# Patient Record
Sex: Female | Born: 1935 | Race: White | Hispanic: Yes | Marital: Single | Smoking: Never smoker
Health system: Southern US, Community
[De-identification: ages and names within clinical notes are randomized; demographics above are authoritative.]

## PROBLEM LIST (undated history)

## (undated) DIAGNOSIS — I10 Essential (primary) hypertension: Secondary | ICD-10-CM

## (undated) DIAGNOSIS — F0390 Unspecified dementia without behavioral disturbance: Secondary | ICD-10-CM

---

## 2020-07-02 ENCOUNTER — Encounter (HOSPITAL_COMMUNITY): Payer: Self-pay | Admitting: Emergency Medicine

## 2020-07-02 ENCOUNTER — Emergency Department (HOSPITAL_COMMUNITY)
Admission: EM | Admit: 2020-07-02 | Discharge: 2020-07-02 | Disposition: A | Discharge status: Home / Self Care | Payer: Medicaid Other | Attending: Emergency Medicine | Admitting: Emergency Medicine

## 2020-07-02 ENCOUNTER — Emergency Department (HOSPITAL_COMMUNITY): Payer: Medicaid Other

## 2020-07-02 DIAGNOSIS — Z20822 Contact with and (suspected) exposure to covid-19: Secondary | ICD-10-CM | POA: Diagnosis not present

## 2020-07-02 DIAGNOSIS — I10 Essential (primary) hypertension: Secondary | ICD-10-CM | POA: Diagnosis not present

## 2020-07-02 DIAGNOSIS — J209 Acute bronchitis, unspecified: Secondary | ICD-10-CM | POA: Diagnosis not present

## 2020-07-02 DIAGNOSIS — R509 Fever, unspecified: Secondary | ICD-10-CM | POA: Diagnosis present

## 2020-07-02 DIAGNOSIS — J208 Acute bronchitis due to other specified organisms: Secondary | ICD-10-CM

## 2020-07-02 LAB — CBC WITH DIFFERENTIAL/PLATELET
Abs Immature Granulocytes: 0.02 10*3/uL (ref 0.00–0.07)
Basophils Absolute: 0 10*3/uL (ref 0.0–0.1)
Basophils Relative: 1 %
Eosinophils Absolute: 0.1 10*3/uL (ref 0.0–0.5)
Eosinophils Relative: 1 %
HCT: 38.2 % (ref 36.0–46.0)
Hemoglobin: 12.5 g/dL (ref 12.0–15.0)
Immature Granulocytes: 0 %
Lymphocytes Relative: 19 %
Lymphs Abs: 1.2 10*3/uL (ref 0.7–4.0)
MCH: 29.9 pg (ref 26.0–34.0)
MCHC: 32.7 g/dL (ref 30.0–36.0)
MCV: 91.4 fL (ref 80.0–100.0)
Monocytes Absolute: 0.8 10*3/uL (ref 0.1–1.0)
Monocytes Relative: 13 %
Neutro Abs: 4.1 10*3/uL (ref 1.7–7.7)
Neutrophils Relative %: 66 %
Platelets: 233 10*3/uL (ref 150–400)
RBC: 4.18 MIL/uL (ref 3.87–5.11)
RDW: 12.8 % (ref 11.5–15.5)
WBC: 6.2 10*3/uL (ref 4.0–10.5)
nRBC: 0 % (ref 0.0–0.2)

## 2020-07-02 LAB — BASIC METABOLIC PANEL
Anion gap: 11 (ref 5–15)
BUN: 14 mg/dL (ref 8–23)
CO2: 24 mmol/L (ref 22–32)
Calcium: 8.9 mg/dL (ref 8.9–10.3)
Chloride: 98 mmol/L (ref 98–111)
Creatinine, Ser: 0.98 mg/dL (ref 0.44–1.00)
GFR, Estimated: 55 mL/min — ABNORMAL LOW (ref >60)
Glucose, Bld: 88 mg/dL (ref 70–99)
Potassium: 5 mmol/L (ref 3.5–5.1)
Sodium: 133 mmol/L — ABNORMAL LOW (ref 135–145)

## 2020-07-02 LAB — RESP PANEL BY RT-PCR (FLU A&B, COVID) ARPGX2
Influenza A by PCR: NEGATIVE
Influenza B by PCR: NEGATIVE
SARS Coronavirus 2 by RT PCR: NEGATIVE

## 2020-07-02 MED ORDER — IPRATROPIUM-ALBUTEROL 0.5-2.5 (3) MG/3ML IN SOLN
3.0000 mL | Freq: Once | RESPIRATORY_TRACT | Status: AC
  Administered 2020-07-02: 3 mL via RESPIRATORY_TRACT
  Filled 2020-07-02: qty 3

## 2020-07-02 MED ORDER — PREDNISONE 20 MG PO TABS
40.0000 mg | ORAL_TABLET | Freq: Once | ORAL | Status: AC
  Administered 2020-07-02: 40 mg via ORAL
  Filled 2020-07-02: qty 2

## 2020-07-02 MED ORDER — BENZONATATE 100 MG PO CAPS
100.0000 mg | ORAL_CAPSULE | Freq: Once | ORAL | Status: AC
  Administered 2020-07-02: 100 mg via ORAL
  Filled 2020-07-02: qty 1

## 2020-07-02 MED ORDER — PREDNISONE 10 MG PO TABS
40.0000 mg | ORAL_TABLET | Freq: Every day | ORAL | 0 refills | Status: DC
Start: 2020-07-02 — End: 2020-10-14

## 2020-07-02 MED ORDER — BENZONATATE 100 MG PO CAPS: 100.0000 mg | ORAL_CAPSULE | Freq: Three times a day (TID) | ORAL | 0 refills | Status: AC | PRN

## 2020-07-02 NOTE — ED Provider Notes (Signed)
[facility] EMERGENCY DEPARTMENT Provider Note   CSN: [id number] Arrival date & time: 07/02/20  1502     History Chief Complaint  Patient presents with  . Fever  . Shortness of Breath    Sharon Reed is a 87 y.o. female.  The history is provided by the patient and a relative. A language interpreter was used.  Fever Shortness of Breath Associated symptoms: fever    Sharon Reed is a 87 y.o. female who presents to the Emergency Department complaining of short of breath. She presents the emergency department accompanied by her daughter for evaluation of shortness of breath, cough that started three days ago. She had a fever to 102 today. She has chest discomfort when she coughs, otherwise no chest pain. No known sick contacts. She had a similar episode about one month ago, which improved after steroids and breathing treatments. She does have breathing treatments available at home that do seem to partially improve her symptoms. She was seen in urgent care yesterday and prescribed Levaquin and tested negative for COVID-19 at that time. She has a history of hypertension, no additional medical problems. No prior history of blood clots.     History reviewed. No pertinent past medical history.  There are no problems to display for this patient.   History reviewed. No pertinent surgical history.   OB History   No obstetric history on file.     No family history on file.  Social History   Tobacco Use  . Smoking status: Never Smoker  . Smokeless tobacco: Never Used  Substance Use Topics  . Alcohol use: Not Currently  . Drug use: Not Currently    Home Medications Prior to Admission medications   Not on File    Allergies    Patient has no allergy information on record.  Review of Systems   Review of Systems  Constitutional: Positive for fever.  Respiratory: Positive for shortness of breath.   All other systems reviewed and are negative.   Physical  Exam Updated Vital Signs BP (!) 160/82   Pulse 72   Temp 99.2 F (37.3 C) (Oral)   Resp 18   Ht 5' (1.524 m)   Wt 46.7 kg   SpO2 97%   BMI 20.12 kg/m   Physical Exam Vitals and nursing note reviewed.  Constitutional:      Appearance: She is well-developed.  HENT:     Head: Normocephalic and atraumatic.  Cardiovascular:     Rate and Rhythm: Normal rate and regular rhythm.     Heart sounds: No murmur heard.   Pulmonary:     Effort: Pulmonary effort is normal. No respiratory distress.     Comments: Decreased air movement bilaterally. Normal work of breathing. Abdominal:     Palpations: Abdomen is soft.     Tenderness: There is no abdominal tenderness. There is no guarding or rebound.  Musculoskeletal:        General: No swelling or tenderness.  Skin:    General: Skin is warm and dry.  Neurological:     Mental Status: She is alert and oriented to person, place, and time.  Psychiatric:        Behavior: Behavior normal.     ED Results / Procedures / Treatments   Labs (all labs ordered are listed, but only abnormal results are displayed) Labs Reviewed  BASIC METABOLIC PANEL - Abnormal; Notable for the following components:      Result Value   Sodium 133 (*)  GFR, Estimated 55 (*)    All other components within normal limits  RESP PANEL BY RT-PCR (FLU A&B, COVID) [id number]  CBC WITH DIFFERENTIAL/PLATELET    EKG EKG Interpretation  Date/Time:  Sunday July 02 2020 15:19:09 EDT Ventricular Rate:  79 PR Interval:  142 QRS Duration: 68 QT Interval:  364 QTC Calculation: 417 R Axis:   56 Text Interpretation: Normal sinus rhythm Normal ECG No previous tracing Confirmed by Tilden Fossa [phone]) on 07/02/2020 5:23:45 PM   Radiology DG Chest 2 View  Result Date: 07/02/2020 CLINICAL DATA:  Cough fever, and wheezing starting this morning EXAM: CHEST - 2 VIEW COMPARISON:  07/01/2020 FINDINGS: Normal heart size, mediastinal contours, and pulmonary vascularity.  Atherosclerotic calcification aorta. Hyperinflated lungs without pulmonary infiltrate, pleural effusion, or pneumothorax. Bones demineralized. IMPRESSION: Hyperinflated lungs without acute infiltrate. Aortic Atherosclerosis (ICD10-I70.0). Electronically Signed   By: Ulyses Southward M.D.   On: 07/02/2020 16:25    Procedures Procedures   Medications Ordered in ED Medications  ipratropium-albuterol (DUONEB) 0.5-2.5 (3) MG/3ML nebulizer solution 3 mL (has no administration in time range)  predniSONE (DELTASONE) tablet 40 mg (has no administration in time range)  benzonatate (TESSALON) capsule 100 mg (has no administration in time range)    ED Course  I have reviewed the triage vital signs and the nursing notes.  Pertinent labs & imaging results that were available during my care of the patient were reviewed by me and considered in my medical decision making (see chart for details).    MDM Rules/Calculators/A&P                         patient here for evaluation of fever, cough and shortness of breath for the last three days. She was started on Levaquin from urgent care yesterday. She is non-toxic appearing on evaluation with no respiratory distress. Chest x-ray is clear. She does have decreased air movement concerning for possible reactive airway disease. Will treat with do neb, steroids. Presentation is not consistent with PE, ACS, dissection, acute CHF.  Repeat assessment following duo neb, patient is feeling improved. Repeat lung exam with good air movement bilaterally, no wheezes. Plan to treat for reactive airway disease with prednisone burst, test on PRN as well is continued nebs at home. Will discharge home with outpatient follow-up and return precautions. Final Clinical Impression(s) / ED Diagnoses Final diagnoses:  Acute viral bronchitis    Rx / DC Orders ED Discharge Orders         Ordered    predniSONE (DELTASONE) 10 MG tablet  Daily        Pending    benzonatate (TESSALON) 100  MG capsule  3 times daily PRN        Pending           Tilden Fossa, MD 07/02/20 2023

## 2020-07-02 NOTE — ED Notes (Signed)
Pt's daughter reports fever this morning, productive cough (no blood), increased work of breathing when walking and feeling shob and wheezing. Pt reports CP w/ coughing.  Pt's daughter reports this has been going on for 3 days. Pt's daughter reports pt takes medication for HTN and her heart. Interpreter used for assessment.

## 2020-07-02 NOTE — ED Triage Notes (Signed)
C/o exertional SOB and wheezing.  Reports productive cough x 3 days and fever up to 100.2.  Negative COVID test yesterday.

## 2020-07-02 NOTE — ED Provider Notes (Signed)
Emergency Medicine Provider Triage Evaluation Note  Sharon Reed , a 87 y.o. female  was evaluated in triage.  Pt complains of productive cough for 3 days. Wheezing, shortness of breath with exertion, low grade fever 100.2. went to OSH yesterday and was tested for COVID and influenza (negative). Had CXR but doesn't know results.  History of heart problems, HTN.  Has used breathing treatments at home.  Spanish speaking with relative at bedside. COVID vaccinated x 2.   Review of Systems  Positive: Fever cough SOB wheezing Negative: Leg swelling  Physical Exam  BP (!) 114/59 (BP Location: Left Arm)   Pulse 76   Temp 99.2 F (37.3 C) (Oral)   Resp 16   SpO2 94%  Gen:   Awake, no distress   Resp:  Normal effort, diminished lung sounds diffusely. No wheezing.  MSK:   Moves extremities without difficulty  Cards:  RRR. No LE edema. Medical Decision Making  Medically screening exam initiated at 3:41 PM.  Appropriate orders placed.    Patient made aware this encounter is a triage and screening encounter and no beds are immediately available at this time in the ER.  Patient was informed that the remainder of the evaluation will be completed by another provider.  Patient made aware triage orders have been placed and patient will be placed in the waiting room while work up is initiated and until a room becomes available. Patient encouraged to await a formal ER encounter with a clinician.  Patient made aware that exiting the department prior to formal encounter with an ER clinician and completion of the work-up is considered leaving against medical advice.  At that time there is no guarantee that there are no emergency medical conditions present and patient assumes risks of leaving including worsening condition, permanent disability and death. Patient verbalizes understanding.     Liberty Handy, PA-C 07/02/20 1543    Wynetta Fines, MD 07/02/20 [phone]

## 2020-10-09 ENCOUNTER — Encounter (HOSPITAL_COMMUNITY): Payer: Self-pay

## 2020-10-09 ENCOUNTER — Other Ambulatory Visit: Payer: Self-pay

## 2020-10-09 ENCOUNTER — Emergency Department (HOSPITAL_COMMUNITY): Payer: Medicaid Other

## 2020-10-09 ENCOUNTER — Inpatient Hospital Stay (HOSPITAL_COMMUNITY)
Admission: EM | Admit: 2020-10-09 | Discharge: 2020-10-14 | DRG: 493 | Disposition: A | Discharge status: Home / Self Care | Payer: Medicaid Other | Attending: Family Medicine | Admitting: Family Medicine

## 2020-10-09 DIAGNOSIS — S82209A Unspecified fracture of shaft of unspecified tibia, initial encounter for closed fracture: Secondary | ICD-10-CM | POA: Diagnosis present

## 2020-10-09 DIAGNOSIS — D62 Acute posthemorrhagic anemia: Secondary | ICD-10-CM | POA: Diagnosis not present

## 2020-10-09 DIAGNOSIS — F0390 Unspecified dementia without behavioral disturbance: Secondary | ICD-10-CM | POA: Diagnosis present

## 2020-10-09 DIAGNOSIS — N1831 Chronic kidney disease, stage 3a: Secondary | ICD-10-CM | POA: Diagnosis present

## 2020-10-09 DIAGNOSIS — S82141A Displaced bicondylar fracture of right tibia, initial encounter for closed fracture: Principal | ICD-10-CM | POA: Diagnosis present

## 2020-10-09 DIAGNOSIS — M1990 Unspecified osteoarthritis, unspecified site: Secondary | ICD-10-CM | POA: Diagnosis present

## 2020-10-09 DIAGNOSIS — Z880 Allergy status to penicillin: Secondary | ICD-10-CM

## 2020-10-09 DIAGNOSIS — I129 Hypertensive chronic kidney disease with stage 1 through stage 4 chronic kidney disease, or unspecified chronic kidney disease: Secondary | ICD-10-CM | POA: Diagnosis present

## 2020-10-09 DIAGNOSIS — Z23 Encounter for immunization: Secondary | ICD-10-CM

## 2020-10-09 DIAGNOSIS — T148XXA Other injury of unspecified body region, initial encounter: Secondary | ICD-10-CM

## 2020-10-09 DIAGNOSIS — S52501A Unspecified fracture of the lower end of right radius, initial encounter for closed fracture: Secondary | ICD-10-CM | POA: Diagnosis not present

## 2020-10-09 DIAGNOSIS — Y92410 Unspecified street and highway as the place of occurrence of the external cause: Secondary | ICD-10-CM

## 2020-10-09 DIAGNOSIS — Z87891 Personal history of nicotine dependence: Secondary | ICD-10-CM

## 2020-10-09 DIAGNOSIS — R0902 Hypoxemia: Secondary | ICD-10-CM | POA: Diagnosis present

## 2020-10-09 DIAGNOSIS — Z20822 Contact with and (suspected) exposure to covid-19: Secondary | ICD-10-CM | POA: Diagnosis present

## 2020-10-09 DIAGNOSIS — T07XXXA Unspecified multiple injuries, initial encounter: Secondary | ICD-10-CM | POA: Diagnosis present

## 2020-10-09 DIAGNOSIS — S82101A Unspecified fracture of upper end of right tibia, initial encounter for closed fracture: Secondary | ICD-10-CM

## 2020-10-09 DIAGNOSIS — Z419 Encounter for procedure for purposes other than remedying health state, unspecified: Secondary | ICD-10-CM

## 2020-10-09 HISTORY — DX: Essential (primary) hypertension: I10

## 2020-10-09 HISTORY — DX: Unspecified dementia, unspecified severity, without behavioral disturbance, psychotic disturbance, mood disturbance, and anxiety: F03.90

## 2020-10-09 LAB — CBC WITH DIFFERENTIAL/PLATELET
Abs Immature Granulocytes: 0.06 10*3/uL (ref 0.00–0.07)
Basophils Absolute: 0.1 10*3/uL (ref 0.0–0.1)
Basophils Relative: 0 %
Eosinophils Absolute: 0 10*3/uL (ref 0.0–0.5)
Eosinophils Relative: 0 %
HCT: 36.7 % (ref 36.0–46.0)
Hemoglobin: 12.1 g/dL (ref 12.0–15.0)
Immature Granulocytes: 0 %
Lymphocytes Relative: 3 %
Lymphs Abs: 0.4 10*3/uL — ABNORMAL LOW (ref 0.7–4.0)
MCH: 29.9 pg (ref 26.0–34.0)
MCHC: 33 g/dL (ref 30.0–36.0)
MCV: 90.6 fL (ref 80.0–100.0)
Monocytes Absolute: 0.9 10*3/uL (ref 0.1–1.0)
Monocytes Relative: 6 %
Neutro Abs: 14.3 10*3/uL — ABNORMAL HIGH (ref 1.7–7.7)
Neutrophils Relative %: 91 %
Platelets: 213 10*3/uL (ref 150–400)
RBC: 4.05 MIL/uL (ref 3.87–5.11)
RDW: 12.9 % (ref 11.5–15.5)
WBC: 15.8 10*3/uL — ABNORMAL HIGH (ref 4.0–10.5)
nRBC: 0 % (ref 0.0–0.2)

## 2020-10-09 LAB — BASIC METABOLIC PANEL
Anion gap: 9 (ref 5–15)
BUN: 26 mg/dL — ABNORMAL HIGH (ref 8–23)
CO2: 27 mmol/L (ref 22–32)
Calcium: 9.2 mg/dL (ref 8.9–10.3)
Chloride: 99 mmol/L (ref 98–111)
Creatinine, Ser: 1.03 mg/dL — ABNORMAL HIGH (ref 0.44–1.00)
GFR, Estimated: 52 mL/min — ABNORMAL LOW (ref >60)
Glucose, Bld: 230 mg/dL — ABNORMAL HIGH (ref 70–99)
Potassium: 4.5 mmol/L (ref 3.5–5.1)
Sodium: 135 mmol/L (ref 135–145)

## 2020-10-09 LAB — RESP PANEL BY RT-PCR (FLU A&B, COVID) ARPGX2
Influenza A by PCR: NEGATIVE
Influenza B by PCR: NEGATIVE
SARS Coronavirus 2 by RT PCR: NEGATIVE

## 2020-10-09 MED ORDER — HYDROCODONE-ACETAMINOPHEN 5-325 MG PO TABS
0.5000 | ORAL_TABLET | Freq: Once | ORAL | Status: AC
  Administered 2020-10-09: 0.5 via ORAL
  Filled 2020-10-09: qty 1

## 2020-10-09 MED ORDER — TETANUS-DIPHTH-ACELL PERTUSSIS 5-2.5-18.5 LF-MCG/0.5 IM SUSY
0.5000 mL | PREFILLED_SYRINGE | Freq: Once | INTRAMUSCULAR | Status: AC
  Administered 2020-10-09: 0.5 mL via INTRAMUSCULAR
  Filled 2020-10-09: qty 0.5

## 2020-10-09 MED ORDER — ACETAMINOPHEN 500 MG PO TABS: 1000.0000 mg | ORAL_TABLET | Freq: Four times a day (QID) | ORAL | Status: DC | PRN

## 2020-10-09 MED ORDER — METOPROLOL TARTRATE 100 MG PO TABS
100.0000 mg | ORAL_TABLET | Freq: Two times a day (BID) | ORAL | Status: DC
  Administered 2020-10-09 – 2020-10-14 (×8): 100 mg via ORAL
  Filled 2020-10-09 (×7): qty 1
  Filled 2020-10-09: qty 4
  Filled 2020-10-09: qty 1
  Filled 2020-10-09: qty 4
  Filled 2020-10-09: qty 1

## 2020-10-09 MED ORDER — OXYCODONE HCL 5 MG PO TABS
5.0000 mg | ORAL_TABLET | ORAL | Status: DC | PRN
Start: 2020-10-09 — End: 2020-10-11
  Administered 2020-10-09 – 2020-10-10 (×3): 5 mg via ORAL
  Filled 2020-10-09 (×3): qty 1

## 2020-10-09 MED ORDER — BISACODYL 5 MG PO TBEC: 5.0000 mg | DELAYED_RELEASE_TABLET | Freq: Every day | ORAL | Status: DC | PRN

## 2020-10-09 MED ORDER — POLYETHYLENE GLYCOL 3350 17 G PO PACK
17.0000 g | PACK | Freq: Every day | ORAL | Status: DC
  Administered 2020-10-10 – 2020-10-14 (×4): 17 g via ORAL
  Filled 2020-10-09 (×5): qty 1

## 2020-10-09 MED ORDER — LOSARTAN POTASSIUM 50 MG PO TABS
50.0000 mg | ORAL_TABLET | Freq: Every day | ORAL | Status: DC
  Administered 2020-10-10: 50 mg via ORAL
  Filled 2020-10-09: qty 1

## 2020-10-09 MED ORDER — ENOXAPARIN SODIUM 40 MG/0.4ML IJ SOSY
40.0000 mg | PREFILLED_SYRINGE | INTRAMUSCULAR | Status: DC
  Administered 2020-10-09 – 2020-10-13 (×5): 40 mg via SUBCUTANEOUS
  Filled 2020-10-09 (×5): qty 0.4

## 2020-10-09 NOTE — Discharge Instructions (Addendum)
[facility] Discharge Instructions   General Discharge Instructions  WEIGHT BEARING STATUS: - Right arm: WBAT through elbow, NWB through wrist - Right leg: WBAT  RANGE OF MOTION/ACTIVITY: - Right arm: Okay for unrestricted ROM of elbow and shoulder. - Right leg: Okay for unrestricted ROM hip, knee, ankle  Wound Care: - Right arm: Maintain splint until follow-up, keep splint covered when showering - Right leg: Okay to leave incision open to air and these incisions may get wet when showering  DVT/PE prophylaxis: Aspirin 325 mg twice daily x 30 days   Diet: as you were eating previously.  Can use over the counter stool softeners and bowel preparations, such as Miralax, to help with bowel movements.  Narcotics can be constipating.  Be sure to drink plenty of fluids  PAIN MEDICATION USE AND EXPECTATIONS  You have likely been given narcotic medications to help control your pain.  After a traumatic event that results in an fracture (broken bone) with or without surgery, it is ok to use narcotic pain medications to help control one's pain.  We understand that everyone responds to pain differently and each individual patient will be evaluated on a regular basis for the continued need for narcotic medications. Ideally, narcotic medication use should last no more than 6-8 weeks (coinciding with fracture healing).   As a patient it is your responsibility as well to monitor narcotic medication use and report the amount and frequency you use these medications when you come to your office visit.   We would also advise that if you are using narcotic medications, you should take a dose prior to therapy to maximize you participation.  IF YOU ARE ON NARCOTIC MEDICATIONS IT IS NOT PERMISSIBLE TO OPERATE A MOTOR VEHICLE (MOTORCYCLE/CAR/TRUCK/MOPED) OR HEAVY MACHINERY DO NOT MIX NARCOTICS WITH OTHER CNS (CENTRAL NERVOUS SYSTEM) DEPRESSANTS SUCH AS ALCOHOL   STOP SMOKING OR USING NICOTINE  PRODUCTS!!!!  As discussed nicotine severely impairs your body's ability to heal surgical and traumatic wounds but also impairs bone healing.  Wounds and bone heal by forming microscopic blood vessels (angiogenesis) and nicotine is a vasoconstrictor (essentially, shrinks blood vessels).  Therefore, if vasoconstriction occurs to these microscopic blood vessels they essentially disappear and are unable to deliver necessary nutrients to the healing tissue.  This is one modifiable factor that you can do to dramatically increase your chances of healing your injury.    (This means no smoking, no nicotine gum, patches, etc)  DO NOT USE NONSTEROIDAL ANTI-INFLAMMATORY DRUGS (NSAID'S)  Using products such as Advil (ibuprofen), Aleve (naproxen), Motrin (ibuprofen) for additional pain control during fracture healing can delay and/or prevent the healing response.  If you would like to take over the counter (OTC) medication, Tylenol (acetaminophen) is ok.  However, some narcotic medications that are given for pain control contain acetaminophen as well. Therefore, you should not exceed more than 4000 mg of tylenol in a day if you do not have liver disease.  Also note that there are may OTC medicines, such as cold medicines and allergy medicines that my contain tylenol as well.  If you have any questions about medications and/or interactions please ask your doctor/PA or your pharmacist.      ICE AND ELEVATE INJURED/OPERATIVE EXTREMITY  Using ice and elevating the injured extremity above your heart can help with swelling and pain control.  Icing in a pulsatile fashion, such as 20 minutes on and 20 minutes off, can be followed.    Do not place ice  directly on skin. Make sure there is a barrier between to skin and the ice pack.    Using frozen items such as frozen peas works well as the conform nicely to the are that needs to be iced.  USE AN ACE WRAP OR TED HOSE FOR SWELLING CONTROL  In addition to icing and elevation,  Ace wraps or TED hose are used to help limit and resolve swelling.  It is recommended to use Ace wraps or TED hose until you are informed to stop.    When using Ace Wraps start the wrapping distally (farthest away from the body) and wrap proximally (closer to the body)   Example: If you had surgery on your leg or thing and you do not have a splint on, start the ace wrap at the toes and work your way up to the thigh        If you had surgery on your upper extremity and do not have a splint on, start the ace wrap at your fingers and work your way up to the upper arm  IF YOU ARE IN A SPLINT OR CAST DO NOT REMOVE IT FOR ANY REASON   If your splint gets wet for any reason please contact the office immediately. You may shower in your splint or cast as long as you keep it dry.  This can be done by wrapping in a cast cover or garbage back (or similar)  Do Not stick any thing down your splint or cast such as pencils, money, or hangers to try and scratch yourself with.  If you feel itchy take benadryl as prescribed on the bottle for itching    CALL THE OFFICE WITH ANY QUESTIONS OR CONCERNS: [phone]   VISIT OUR WEBSITE FOR ADDITIONAL INFORMATION: [email or web address]     Discharge Wound Care Instructions  Do NOT apply any ointments, solutions or lotions to pin sites or surgical wounds.  These prevent needed drainage and even though solutions like hydrogen peroxide kill bacteria, they also damage cells lining the pin sites that help fight infection.  Applying lotions or ointments can keep the wounds moist and can cause them to breakdown and open up as well. This can increase the risk for infection. When in doubt call the office.  If any drainage is noted, use one layer of adaptic, then gauze, Kerlix, and an ace wrap.  Once the incision is completely dry and without drainage, it may be left open to air out.  Showering may begin 36-48 hours later.  Cleaning gently with soap and water.

## 2020-10-09 NOTE — ED Notes (Signed)
Patient transported to CT 
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
## 2020-10-09 NOTE — ED Notes (Signed)
Patient transported to X-ray 

## 2020-10-09 NOTE — Care Management (Signed)
ED RN Care Manager  met with patient and family to discuss possible [facility] services, but  ED evaluation is still pending. TOC  team will continue to follow for discharge plan.
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
## 2020-10-10 DIAGNOSIS — I129 Hypertensive chronic kidney disease with stage 1 through stage 4 chronic kidney disease, or unspecified chronic kidney disease: Secondary | ICD-10-CM | POA: Diagnosis present

## 2020-10-10 DIAGNOSIS — Y92410 Unspecified street and highway as the place of occurrence of the external cause: Secondary | ICD-10-CM | POA: Diagnosis not present

## 2020-10-10 DIAGNOSIS — Z87891 Personal history of nicotine dependence: Secondary | ICD-10-CM | POA: Diagnosis not present

## 2020-10-10 DIAGNOSIS — I10 Essential (primary) hypertension: Secondary | ICD-10-CM | POA: Diagnosis not present

## 2020-10-10 DIAGNOSIS — Z880 Allergy status to penicillin: Secondary | ICD-10-CM | POA: Diagnosis not present

## 2020-10-10 DIAGNOSIS — S82209A Unspecified fracture of shaft of unspecified tibia, initial encounter for closed fracture: Secondary | ICD-10-CM | POA: Diagnosis present

## 2020-10-10 DIAGNOSIS — R0902 Hypoxemia: Secondary | ICD-10-CM | POA: Diagnosis present

## 2020-10-10 DIAGNOSIS — S82141A Displaced bicondylar fracture of right tibia, initial encounter for closed fracture: Secondary | ICD-10-CM | POA: Diagnosis present

## 2020-10-10 DIAGNOSIS — Z23 Encounter for immunization: Secondary | ICD-10-CM | POA: Diagnosis not present

## 2020-10-10 DIAGNOSIS — N1831 Chronic kidney disease, stage 3a: Secondary | ICD-10-CM | POA: Diagnosis present

## 2020-10-10 DIAGNOSIS — F0390 Unspecified dementia without behavioral disturbance: Secondary | ICD-10-CM | POA: Diagnosis present

## 2020-10-10 DIAGNOSIS — S52501A Unspecified fracture of the lower end of right radius, initial encounter for closed fracture: Secondary | ICD-10-CM | POA: Diagnosis present

## 2020-10-10 DIAGNOSIS — D62 Acute posthemorrhagic anemia: Secondary | ICD-10-CM | POA: Diagnosis not present

## 2020-10-10 DIAGNOSIS — M1990 Unspecified osteoarthritis, unspecified site: Secondary | ICD-10-CM | POA: Diagnosis present

## 2020-10-10 DIAGNOSIS — T07XXXA Unspecified multiple injuries, initial encounter: Secondary | ICD-10-CM

## 2020-10-10 DIAGNOSIS — Z20822 Contact with and (suspected) exposure to covid-19: Secondary | ICD-10-CM | POA: Diagnosis present

## 2020-10-10 LAB — CBC
HCT: 37.3 % (ref 36.0–46.0)
HCT: 33.5 % — ABNORMAL LOW (ref 36.0–46.0)
Hemoglobin: 12.3 g/dL (ref 12.0–15.0)
Hemoglobin: 11.3 g/dL — ABNORMAL LOW (ref 12.0–15.0)
MCH: 29.6 pg (ref 26.0–34.0)
MCH: 29.7 pg (ref 26.0–34.0)
MCHC: 33 g/dL (ref 30.0–36.0)
MCHC: 33.7 g/dL (ref 30.0–36.0)
MCV: 89.7 fL (ref 80.0–100.0)
MCV: 87.9 fL (ref 80.0–100.0)
Platelets: 221 10*3/uL (ref 150–400)
Platelets: 202 10*3/uL (ref 150–400)
RBC: 4.16 MIL/uL (ref 3.87–5.11)
RBC: 3.81 MIL/uL — ABNORMAL LOW (ref 3.87–5.11)
RDW: 12.9 % (ref 11.5–15.5)
RDW: 13.2 % (ref 11.5–15.5)
WBC: 16.7 10*3/uL — ABNORMAL HIGH (ref 4.0–10.5)
WBC: 15.3 10*3/uL — ABNORMAL HIGH (ref 4.0–10.5)
nRBC: 0 % (ref 0.0–0.2)
nRBC: 0 % (ref 0.0–0.2)

## 2020-10-10 LAB — BASIC METABOLIC PANEL
Anion gap: 7 (ref 5–15)
BUN: 26 mg/dL — ABNORMAL HIGH (ref 8–23)
CO2: 29 mmol/L (ref 22–32)
Calcium: 9.2 mg/dL (ref 8.9–10.3)
Chloride: 98 mmol/L (ref 98–111)
Creatinine, Ser: 1.02 mg/dL — ABNORMAL HIGH (ref 0.44–1.00)
GFR, Estimated: 52 mL/min — ABNORMAL LOW (ref >60)
Glucose, Bld: 137 mg/dL — ABNORMAL HIGH (ref 70–99)
Potassium: 4.3 mmol/L (ref 3.5–5.1)
Sodium: 134 mmol/L — ABNORMAL LOW (ref 135–145)

## 2020-10-10 LAB — CREATININE, SERUM
Creatinine, Ser: 1.05 mg/dL — ABNORMAL HIGH (ref 0.44–1.00)
GFR, Estimated: 50 mL/min — ABNORMAL LOW (ref >60)

## 2020-10-10 MED ORDER — HYDRALAZINE HCL 25 MG PO TABS: 25.0000 mg | ORAL_TABLET | Freq: Three times a day (TID) | ORAL | Status: DC | PRN

## 2020-10-10 MED ORDER — LOSARTAN POTASSIUM 50 MG PO TABS
50.0000 mg | ORAL_TABLET | Freq: Every day | ORAL | Status: DC
  Administered 2020-10-12 – 2020-10-14 (×3): 50 mg via ORAL
  Filled 2020-10-10 (×3): qty 1

## 2020-10-10 MED ORDER — ACETAMINOPHEN 500 MG PO TABS
1000.0000 mg | ORAL_TABLET | Freq: Three times a day (TID) | ORAL | Status: DC
  Administered 2020-10-10 – 2020-10-11 (×4): 1000 mg via ORAL
  Filled 2020-10-10 (×4): qty 2
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
## 2020-10-10 NOTE — ED Notes (Signed)
Straight cathed for urine 500 ml output
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
## 2020-10-10 NOTE — H&P (Signed)
I have reviewed X-rays. Tentative plan:  Non op R knee in knee immobilizer. NWB  ORIF R forearm while inpatient. Timing TBD long arm splint for now  Formal consult to follow

## 2020-10-11 ENCOUNTER — Inpatient Hospital Stay (HOSPITAL_COMMUNITY): Payer: Medicaid Other

## 2020-10-11 ENCOUNTER — Encounter (HOSPITAL_COMMUNITY): Payer: Self-pay | Admitting: Family Medicine

## 2020-10-11 ENCOUNTER — Encounter (HOSPITAL_COMMUNITY)
Admission: EM | Disposition: A | Discharge status: Home / Self Care | Payer: Self-pay | Source: Home / Self Care | Attending: Family Medicine

## 2020-10-11 DIAGNOSIS — I10 Essential (primary) hypertension: Secondary | ICD-10-CM | POA: Diagnosis not present

## 2020-10-11 DIAGNOSIS — T07XXXA Unspecified multiple injuries, initial encounter: Secondary | ICD-10-CM | POA: Diagnosis not present

## 2020-10-11 HISTORY — PX: ORIF RADIAL FRACTURE: SHX5113

## 2020-10-11 HISTORY — PX: ORIF TIBIA PLATEAU: SHX2132

## 2020-10-11 LAB — SURGICAL PCR SCREEN
MRSA, PCR: NEGATIVE
Staphylococcus aureus: NEGATIVE

## 2020-10-11 SURGERY — OPEN REDUCTION INTERNAL FIXATION (ORIF) RADIAL FRACTURE
Anesthesia: General | Laterality: Right

## 2020-10-11 MED ORDER — 0.9 % SODIUM CHLORIDE (POUR BTL) OPTIME
TOPICAL | Status: DC | PRN
  Administered 2020-10-11: 1000 mL

## 2020-10-11 MED ORDER — FENTANYL CITRATE (PF) 100 MCG/2ML IJ SOLN
INTRAMUSCULAR | Status: AC
  Filled 2020-10-11: qty 2

## 2020-10-11 MED ORDER — FENTANYL CITRATE (PF) 250 MCG/5ML IJ SOLN
INTRAMUSCULAR | Status: DC | PRN
  Administered 2020-10-11 (×3): 25 ug via INTRAVENOUS

## 2020-10-11 MED ORDER — METOCLOPRAMIDE HCL 5 MG/ML IJ SOLN: 5.0000 mg | Freq: Three times a day (TID) | INTRAMUSCULAR | Status: DC | PRN

## 2020-10-11 MED ORDER — MORPHINE SULFATE (PF) 2 MG/ML IV SOLN: 0.5000 mg | INTRAVENOUS | Status: DC | PRN

## 2020-10-11 MED ORDER — ONDANSETRON HCL 4 MG PO TABS: 4.0000 mg | ORAL_TABLET | Freq: Four times a day (QID) | ORAL | Status: DC | PRN

## 2020-10-11 MED ORDER — PHENYLEPHRINE 40 MCG/ML (10ML) SYRINGE FOR IV PUSH (FOR BLOOD PRESSURE SUPPORT)
PREFILLED_SYRINGE | INTRAVENOUS | Status: DC | PRN
Start: 2020-10-11 — End: 2020-10-11
  Administered 2020-10-11: 120 ug via INTRAVENOUS
  Administered 2020-10-11: 160 ug via INTRAVENOUS
  Administered 2020-10-11: 40 ug via INTRAVENOUS

## 2020-10-11 MED ORDER — ONDANSETRON HCL 4 MG/2ML IJ SOLN
INTRAMUSCULAR | Status: DC | PRN
  Administered 2020-10-11: 4 mg via INTRAVENOUS

## 2020-10-11 MED ORDER — PHENYLEPHRINE 40 MCG/ML (10ML) SYRINGE FOR IV PUSH (FOR BLOOD PRESSURE SUPPORT)
PREFILLED_SYRINGE | INTRAVENOUS | Status: AC
  Filled 2020-10-11: qty 10

## 2020-10-11 MED ORDER — ONDANSETRON HCL 4 MG/2ML IJ SOLN: 4.0000 mg | Freq: Four times a day (QID) | INTRAMUSCULAR | Status: DC | PRN

## 2020-10-11 MED ORDER — PROPOFOL 10 MG/ML IV BOLUS
INTRAVENOUS | Status: DC | PRN
  Administered 2020-10-11: 30 mg via INTRAVENOUS
  Administered 2020-10-11: 20 mg via INTRAVENOUS
  Administered 2020-10-11: 50 mg via INTRAVENOUS

## 2020-10-11 MED ORDER — FENTANYL CITRATE (PF) 250 MCG/5ML IJ SOLN
INTRAMUSCULAR | Status: AC
  Filled 2020-10-11: qty 5

## 2020-10-11 MED ORDER — SODIUM CHLORIDE 0.9 % IV SOLN: INTRAVENOUS | Status: DC

## 2020-10-11 MED ORDER — ONDANSETRON HCL 4 MG/2ML IJ SOLN
INTRAMUSCULAR | Status: AC
  Filled 2020-10-11: qty 2

## 2020-10-11 MED ORDER — DOCUSATE SODIUM 100 MG PO CAPS
100.0000 mg | ORAL_CAPSULE | Freq: Two times a day (BID) | ORAL | Status: DC
  Administered 2020-10-11 – 2020-10-14 (×6): 100 mg via ORAL
  Filled 2020-10-11 (×6): qty 1

## 2020-10-11 MED ORDER — EPHEDRINE SULFATE-NACL 50-0.9 MG/10ML-% IV SOSY
PREFILLED_SYRINGE | INTRAVENOUS | Status: DC | PRN
  Administered 2020-10-11: 10 mg via INTRAVENOUS
  Administered 2020-10-11: 15 mg via INTRAVENOUS

## 2020-10-11 MED ORDER — ORAL CARE MOUTH RINSE: 15.0000 mL | Freq: Once | OROMUCOSAL | Status: AC

## 2020-10-11 MED ORDER — METOCLOPRAMIDE HCL 5 MG PO TABS: 5.0000 mg | ORAL_TABLET | Freq: Three times a day (TID) | ORAL | Status: DC | PRN

## 2020-10-11 MED ORDER — CEFAZOLIN SODIUM-DEXTROSE 2-4 GM/100ML-% IV SOLN
2.0000 g | Freq: Three times a day (TID) | INTRAVENOUS | Status: AC
  Administered 2020-10-11 – 2020-10-12 (×3): 2 g via INTRAVENOUS
  Filled 2020-10-11 (×3): qty 100

## 2020-10-11 MED ORDER — VANCOMYCIN HCL 1000 MG IV SOLR
INTRAVENOUS | Status: AC
  Filled 2020-10-11: qty 40

## 2020-10-11 MED ORDER — DEXAMETHASONE SODIUM PHOSPHATE 10 MG/ML IJ SOLN
INTRAMUSCULAR | Status: AC
  Filled 2020-10-11: qty 1

## 2020-10-11 MED ORDER — ACETAMINOPHEN 500 MG PO TABS
1000.0000 mg | ORAL_TABLET | Freq: Three times a day (TID) | ORAL | Status: AC
  Administered 2020-10-11 – 2020-10-12 (×4): 1000 mg via ORAL
  Filled 2020-10-11 (×4): qty 2

## 2020-10-11 MED ORDER — CHLORHEXIDINE GLUCONATE 0.12 % MT SOLN: 15.0000 mL | Freq: Once | OROMUCOSAL | Status: AC

## 2020-10-11 MED ORDER — BUPIVACAINE-EPINEPHRINE (PF) 0.5% -1:200000 IJ SOLN
INTRAMUSCULAR | Status: DC | PRN
  Administered 2020-10-11: 15 mL via PERINEURAL

## 2020-10-11 MED ORDER — CEFAZOLIN SODIUM-DEXTROSE 2-4 GM/100ML-% IV SOLN
2.0000 g | INTRAVENOUS | Status: AC
  Administered 2020-10-11: 2 g via INTRAVENOUS
  Filled 2020-10-11: qty 100

## 2020-10-11 MED ORDER — DEXAMETHASONE SODIUM PHOSPHATE 10 MG/ML IJ SOLN
INTRAMUSCULAR | Status: DC | PRN
  Administered 2020-10-11: 10 mg via INTRAVENOUS

## 2020-10-11 MED ORDER — EPHEDRINE 5 MG/ML INJ
INTRAVENOUS | Status: AC
  Filled 2020-10-11: qty 5

## 2020-10-11 MED ORDER — CHLORHEXIDINE GLUCONATE 0.12 % MT SOLN
OROMUCOSAL | Status: AC
  Administered 2020-10-11: 15 mL
  Filled 2020-10-11: qty 15

## 2020-10-11 MED ORDER — PROPOFOL 10 MG/ML IV BOLUS
INTRAVENOUS | Status: AC
  Filled 2020-10-11: qty 20

## 2020-10-11 MED ORDER — OXYCODONE HCL 5 MG PO TABS
5.0000 mg | ORAL_TABLET | ORAL | Status: DC | PRN
Start: 2020-10-11 — End: 2020-10-13
  Administered 2020-10-12 – 2020-10-13 (×2): 5 mg via ORAL
  Filled 2020-10-11 (×2): qty 1

## 2020-10-11 MED ORDER — LACTATED RINGERS IV SOLN: INTRAVENOUS | Status: DC

## 2020-10-11 MED ORDER — LIDOCAINE 2% (20 MG/ML) 5 ML SYRINGE
INTRAMUSCULAR | Status: DC | PRN
Start: 2020-10-11 — End: 2020-10-11
  Administered 2020-10-11: 60 mg via INTRAVENOUS

## 2020-10-11 MED ORDER — CHLORHEXIDINE GLUCONATE 4 % EX LIQD: 60.0000 mL | Freq: Once | CUTANEOUS | Status: DC

## 2020-10-11 MED ORDER — FENTANYL CITRATE (PF) 100 MCG/2ML IJ SOLN: 25.0000 ug | INTRAMUSCULAR | Status: DC | PRN

## 2020-10-11 MED ORDER — VANCOMYCIN HCL 1000 MG IV SOLR
INTRAVENOUS | Status: DC | PRN
  Administered 2020-10-11: 1000 mg

## 2020-10-11 MED ORDER — POVIDONE-IODINE 10 % EX SWAB
2.0000 "application " | Freq: Once | CUTANEOUS | Status: AC
  Administered 2020-10-11: 2 via TOPICAL

## 2020-10-11 MED ORDER — HYDROCODONE-ACETAMINOPHEN 5-325 MG PO TABS
1.0000 | ORAL_TABLET | ORAL | Status: DC | PRN
  Administered 2020-10-11: 1 via ORAL
  Filled 2020-10-11: qty 1

## 2020-10-11 MED ORDER — TRANEXAMIC ACID-NACL 1000-0.7 MG/100ML-% IV SOLN
1000.0000 mg | INTRAVENOUS | Status: AC
  Administered 2020-10-11: 1000 mg via INTRAVENOUS
  Filled 2020-10-11 (×2): qty 100

## 2020-10-11 SURGICAL SUPPLY — 100 items
BAG COUNTER SPONGE SURGICOUNT (BAG) ×2 IMPLANT
BANDAGE ESMARK 6X9 LF (GAUZE/BANDAGES/DRESSINGS) ×1 IMPLANT
BIT DRILL CALIBR QC 2.8X250 (BIT) ×1 IMPLANT
BIT DRILL QC 2.5X135 (BIT) ×1 IMPLANT
BIT DRILL QC 2X140 (BIT) ×1 IMPLANT
BLADE CLIPPER SURG (BLADE) IMPLANT
BLADE SURG 15 STRL LF DISP TIS (BLADE) ×1 IMPLANT
BLADE SURG 15 STRL SS (BLADE) ×1
BNDG ELASTIC 3X5.8 VLCR STR LF (GAUZE/BANDAGES/DRESSINGS) ×2 IMPLANT
BNDG ELASTIC 4X5.8 VLCR STR LF (GAUZE/BANDAGES/DRESSINGS) ×4 IMPLANT
BNDG ELASTIC 6X5.8 VLCR STR LF (GAUZE/BANDAGES/DRESSINGS) ×1 IMPLANT
BNDG ESMARK 4X9 LF (GAUZE/BANDAGES/DRESSINGS) ×2 IMPLANT
BNDG ESMARK 6X9 LF (GAUZE/BANDAGES/DRESSINGS) ×2
BNDG GAUZE ELAST 4 BULKY (GAUZE/BANDAGES/DRESSINGS) ×1 IMPLANT
BRUSH SCRUB EZ PLAIN DRY (MISCELLANEOUS) ×2 IMPLANT
CANISTER SUCT 3000ML PPV (MISCELLANEOUS) ×2 IMPLANT
CHLORAPREP W/TINT 26 (MISCELLANEOUS) ×6 IMPLANT
COVER SURGICAL LIGHT HANDLE (MISCELLANEOUS) ×2 IMPLANT
CUFF TOURN SGL QUICK 18X4 (TOURNIQUET CUFF) ×1 IMPLANT
CUFF TOURN SGL QUICK 24 (TOURNIQUET CUFF)
CUFF TOURN SGL QUICK 34 (TOURNIQUET CUFF)
CUFF TRNQT CYL 24X4X16.5-23 (TOURNIQUET CUFF) IMPLANT
CUFF TRNQT CYL 34X4.125X (TOURNIQUET CUFF) ×1 IMPLANT
DRAPE C-ARM 35X43 STRL (DRAPES) IMPLANT
DRAPE C-ARM 42X72 X-RAY (DRAPES) ×2 IMPLANT
DRAPE C-ARMOR (DRAPES) ×2 IMPLANT
DRAPE ORTHO SPLIT 77X108 STRL (DRAPES) ×2
DRAPE SURG ORHT 6 SPLT 77X108 (DRAPES) ×2 IMPLANT
DRAPE U-SHAPE 47X51 STRL (DRAPES) ×2 IMPLANT
DRSG MEPITEL 8X12 (GAUZE/BANDAGES/DRESSINGS) ×1 IMPLANT
DRSG PAD ABDOMINAL 8X10 ST (GAUZE/BANDAGES/DRESSINGS) ×2 IMPLANT
ELECT REM PT RETURN 9FT ADLT (ELECTROSURGICAL) ×2
ELECTRODE REM PT RTRN 9FT ADLT (ELECTROSURGICAL) ×1 IMPLANT
GAUZE SPONGE 4X4 12PLY STRL (GAUZE/BANDAGES/DRESSINGS) ×3 IMPLANT
GAUZE XEROFORM 1X8 LF (GAUZE/BANDAGES/DRESSINGS) ×1 IMPLANT
GLOVE SURG ENC MOIS LTX SZ6.5 (GLOVE) ×6 IMPLANT
GLOVE SURG ENC MOIS LTX SZ7.5 (GLOVE) ×8 IMPLANT
GLOVE SURG UNDER POLY LF SZ6.5 (GLOVE) ×2 IMPLANT
GLOVE SURG UNDER POLY LF SZ7 (GLOVE) ×2 IMPLANT
GLOVE SURG UNDER POLY LF SZ7.5 (GLOVE) ×2 IMPLANT
GOWN STRL REUS W/ TWL LRG LVL3 (GOWN DISPOSABLE) ×2 IMPLANT
GOWN STRL REUS W/TWL LRG LVL3 (GOWN DISPOSABLE) ×2
IMMOBILIZER KNEE 22 UNIV (SOFTGOODS) ×1 IMPLANT
K-WIRE 1.25 TRCR POINT 150 (WIRE) ×2
K-WIRE 1.6X150 (WIRE) ×2
KIT BASIN OR (CUSTOM PROCEDURE TRAY) ×2 IMPLANT
KIT TURNOVER KIT B (KITS) ×2 IMPLANT
KWIRE 1.25 TRCR POINT 150 (WIRE) IMPLANT
KWIRE 1.6X150 (WIRE) IMPLANT
MANIFOLD NEPTUNE II (INSTRUMENTS) ×2 IMPLANT
NDL SUT 6 .5 CRC .975X.05 MAYO (NEEDLE) ×1 IMPLANT
NEEDLE 22X1 1/2 (OR ONLY) (NEEDLE) IMPLANT
NEEDLE MAYO TAPER (NEEDLE) ×1
NS IRRIG 1000ML POUR BTL (IV SOLUTION) ×2 IMPLANT
PACK ORTHO EXTREMITY (CUSTOM PROCEDURE TRAY) ×2 IMPLANT
PACK TOTAL JOINT (CUSTOM PROCEDURE TRAY) ×2 IMPLANT
PAD ARMBOARD 7.5X6 YLW CONV (MISCELLANEOUS) ×4 IMPLANT
PAD CAST 3X4 CTTN HI CHSV (CAST SUPPLIES) IMPLANT
PAD CAST 4YDX4 CTTN HI CHSV (CAST SUPPLIES) ×1 IMPLANT
PADDING CAST COTTON 3X4 STRL (CAST SUPPLIES) ×1
PADDING CAST COTTON 4X4 STRL (CAST SUPPLIES) ×3
PADDING CAST COTTON 6X4 STRL (CAST SUPPLIES) ×1 IMPLANT
PLATE LOCK VA-LCP F/3.5X117 6H (Plate) ×1 IMPLANT
PROS LCP PLATE 8H 111M (Plate) ×2 IMPLANT
PROSTHESIS LCP PLATE 8H 111M (Plate) IMPLANT
SCREW CORTEX 3.5 14MM (Screw) ×1 IMPLANT
SCREW CORTEX 3.5 16MM (Screw) ×5 IMPLANT
SCREW CORTEX 3.5 28MM (Screw) ×1 IMPLANT
SCREW CORTEX 3.5 30MM (Screw) ×1 IMPLANT
SCREW CORTEX 3.5 36MM (Screw) ×1 IMPLANT
SCREW CORTEX 3.5X75MM (Screw) ×1 IMPLANT
SCREW LOCK CORT ST 3.5X14 (Screw) IMPLANT
SCREW LOCK CORT ST 3.5X16 (Screw) IMPLANT
SCREW LOCK CORT ST 3.5X28 (Screw) IMPLANT
SCREW LOCK CORT ST 3.5X30 (Screw) IMPLANT
SCREW LOCK CORT ST 3.5X36 (Screw) IMPLANT
SCREW LOCKING 3.5X70MM VA (Screw) ×2 IMPLANT
SCREW LOCKING VA 3.5X75MM (Screw) ×1 IMPLANT
STAPLER VISISTAT 35W (STAPLE) ×2 IMPLANT
SUCTION FRAZIER HANDLE 10FR (MISCELLANEOUS) ×1
SUCTION TUBE FRAZIER 10FR DISP (MISCELLANEOUS) ×1 IMPLANT
SUT ETHILON 3 0 PS 1 (SUTURE) ×3 IMPLANT
SUT FIBERWIRE #2 38 T-5 BLUE (SUTURE) ×2
SUT MNCRL AB 3-0 PS2 27 (SUTURE) ×1 IMPLANT
SUT VIC AB 0 CT1 27 (SUTURE) ×1
SUT VIC AB 0 CT1 27XBRD ANBCTR (SUTURE) IMPLANT
SUT VIC AB 1 CT1 18XCR BRD 8 (SUTURE) IMPLANT
SUT VIC AB 1 CT1 27 (SUTURE)
SUT VIC AB 1 CT1 27XBRD ANBCTR (SUTURE) ×1 IMPLANT
SUT VIC AB 1 CT1 8-18 (SUTURE) ×1
SUT VIC AB 2-0 CT1 27 (SUTURE) ×1
SUT VIC AB 2-0 CT1 TAPERPNT 27 (SUTURE) ×2 IMPLANT
SUTURE FIBERWR #2 38 T-5 BLUE (SUTURE) IMPLANT
SYR CONTROL 10ML LL (SYRINGE) ×2 IMPLANT
TOWEL GREEN STERILE (TOWEL DISPOSABLE) ×4 IMPLANT
TOWEL GREEN STERILE FF (TOWEL DISPOSABLE) ×2 IMPLANT
TRAY FOLEY MTR SLVR 16FR STAT (SET/KITS/TRAYS/PACK) IMPLANT
TUBE CONNECTING 12X1/4 (SUCTIONS) ×2 IMPLANT
WATER STERILE IRR 1000ML POUR (IV SOLUTION) ×2 IMPLANT
YANKAUER SUCT BULB TIP NO VENT (SUCTIONS) IMPLANT
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
## 2020-10-11 NOTE — Transfer of Care (Signed)
Immediate Anesthesia Transfer of Care Note  Patient: Sharon Reed  Procedure(s) Performed: OPEN REDUCTION INTERNAL FIXATION (ORIF) RADIAL FRACTURE (Right) OPEN REDUCTION INTERNAL FIXATION (ORIF) TIBIAL PLATEAU (Right)  Patient Location: PACU  Anesthesia Type:General and Regional  Level of Consciousness: sedated and patient cooperative  Airway & Oxygen Therapy: Patient Spontanous Breathing and Patient connected to nasal cannula oxygen  Post-op Assessment: Report given to RN and Post -op Vital signs reviewed and stable  Post vital signs: Reviewed  Last Vitals:  Vitals Value Taken Time  BP 141/65 10/11/20 1535  Temp    Pulse 69 10/11/20 1540  Resp 20 10/11/20 1540  SpO2 100 % 10/11/20 1540  Vitals shown include unvalidated device data.  Last Pain:  Vitals:   10/11/20 1210  TempSrc: Oral  PainSc:          Complications: No notable events documented.

## 2020-10-11 NOTE — Anesthesia Procedure Notes (Addendum)
Procedure Name: LMA Insertion Date/Time: 10/11/2020 1:18 PM Performed by: Gari Crown, RN Pre-anesthesia Checklist: Patient identified, Emergency Drugs available, Suction available and Patient being monitored Patient Re-evaluated:Patient Re-evaluated prior to induction Oxygen Delivery Method: Circle System Utilized Preoxygenation: Pre-oxygenation with 100% oxygen Induction Type: IV induction Ventilation: Mask ventilation without difficulty LMA: LMA inserted LMA Size: 4.0 Number of attempts: 1 Placement Confirmation: positive ETCO2 and breath sounds checked- equal and bilateral Tube secured with: Tape Dental Injury: Teeth and Oropharynx as per pre-operative assessment
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
## 2020-10-11 NOTE — Therapy (Signed)
OT Cancellation Note  Patient Details Name: Sharon Reed MRN: [id number] DOB: May 23, 1927   Cancelled Treatment:    Reason Eval/Treat Not Completed: Medical issues which prohibited therapy (Orders received and chart reviewed. Pt is scheduled for ORIF to R UE and possibly R LE today. Will hold Eval until next available date or as schedule allows.)  Alm Bustard, OTR/L 10/11/2020, 8:47 AM
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
## 2020-10-11 NOTE — TOC CAGE-AID Note (Signed)
Transition of Care [facility]) - CAGE-AID Screening   Patient Details  Name: Sharon Reed MRN: [id number] Date of Birth: Aug 01, 1927  Transition of Care [facility]) CM/SW Contact:    Leathia Farnell C Tarpley-Carter, LCSWA Phone Number: 10/11/2020, 9:22 AM   Clinical Narrative: Pt is unable to participate in Cage Aid.  Raffaele Derise Tarpley-Carter, MSW, LCSW-A Pronouns:  She/Her/Hers [facility] Clinical Social Worker Direct Number:  [phone] [email or web address]   CAGE-AID Screening: Substance Abuse Screening unable to be completed due to: : Patient unable to participate             Substance Abuse Education Offered: No

## 2020-10-11 NOTE — Anesthesia Procedure Notes (Signed)
Anesthesia Regional Block: Supraclavicular block   Pre-Anesthetic Checklist: , timeout performed,  Correct Patient, Correct Site, Correct Laterality,  Correct Procedure, Correct Position, site marked,  Risks and benefits discussed,  Surgical consent,  Pre-op evaluation,  At surgeon's request and post-op pain management  Laterality: Right  Prep: chloraprep       Needles:  Injection technique: Single-shot  Needle Type: Echogenic Stimulator Needle     Needle Length: 9cm  Needle Gauge: 21     Additional Needles:   Procedures:,,,, ultrasound used (permanent image in chart),,    Narrative:  Start time: 10/11/2020 12:50 PM End time: 10/11/2020 12:55 PM Injection made incrementally with aspirations every 5 mL.  Performed by: Personally  Anesthesiologist: Shelton Silvas, MD  Additional Notes: Patient tolerated the procedure well. Local anesthetic introduced in an incremental fashion under minimal resistance after negative aspirations. No paresthesias were elicited. After completion of the procedure, no acute issues were identified and patient continued to be monitored by RN.

## 2020-10-11 NOTE — Anesthesia Preprocedure Evaluation (Addendum)
Anesthesia Evaluation  Patient identified by MRN, date of birth, ID band Patient confused    Reviewed: Allergy & Precautions, NPO status , Patient's Chart, lab work & pertinent test results  Airway Mallampati: II  TM Distance: >3 FB Neck ROM: Full    Dental  (+) Poor Dentition, Missing, Loose, Chipped, Dental Advisory Given   Pulmonary neg pulmonary ROS,     + decreased breath sounds      Cardiovascular hypertension, Pt. on medications and Pt. on home beta blockers  Rhythm:Regular Rate:Normal     Neuro/Psych PSYCHIATRIC DISORDERS Dementia negative neurological ROS     GI/Hepatic negative GI ROS, Neg liver ROS,   Endo/Other  negative endocrine ROS  Renal/GU negative Renal ROS     Musculoskeletal negative musculoskeletal ROS (+)   Abdominal Normal abdominal exam  (+)   Peds  Hematology negative hematology ROS (+)   Anesthesia Other Findings   Reproductive/Obstetrics                            Anesthesia Physical Anesthesia Plan  ASA: 4  Anesthesia Plan: General   Post-op Pain Management: GA combined w/ Regional for post-op pain   Induction: Intravenous  PONV Risk Score and Plan: 4 or greater and Ondansetron and Treatment may vary due to age or medical condition  Airway Management Planned: LMA  Additional Equipment: None  Intra-op Plan:   Post-operative Plan: Extubation in OR  Informed Consent: I have reviewed the patients History and Physical, chart, labs and discussed the procedure including the risks, benefits and alternatives for the proposed anesthesia with the patient or authorized representative who has indicated his/her understanding and acceptance.     Dental advisory given, Consent reviewed with POA and Interpreter used for interveiw  Plan Discussed with: CRNA  Anesthesia Plan Comments:         Anesthesia Quick Evaluation
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
## 2020-10-12 DIAGNOSIS — T07XXXA Unspecified multiple injuries, initial encounter: Secondary | ICD-10-CM | POA: Diagnosis not present

## 2020-10-12 DIAGNOSIS — I10 Essential (primary) hypertension: Secondary | ICD-10-CM | POA: Diagnosis not present

## 2020-10-12 LAB — CBC
HCT: 31.2 % — ABNORMAL LOW (ref 36.0–46.0)
Hemoglobin: 10.5 g/dL — ABNORMAL LOW (ref 12.0–15.0)
MCH: 30.1 pg (ref 26.0–34.0)
MCHC: 33.7 g/dL (ref 30.0–36.0)
MCV: 89.4 fL (ref 80.0–100.0)
Platelets: 160 10*3/uL (ref 150–400)
RBC: 3.49 MIL/uL — ABNORMAL LOW (ref 3.87–5.11)
RDW: 13 % (ref 11.5–15.5)
WBC: 12.7 10*3/uL — ABNORMAL HIGH (ref 4.0–10.5)
nRBC: 0 % (ref 0.0–0.2)

## 2020-10-12 LAB — BASIC METABOLIC PANEL
Anion gap: 8 (ref 5–15)
BUN: 43 mg/dL — ABNORMAL HIGH (ref 8–23)
CO2: 27 mmol/L (ref 22–32)
Calcium: 8.9 mg/dL (ref 8.9–10.3)
Chloride: 100 mmol/L (ref 98–111)
Creatinine, Ser: 1.35 mg/dL — ABNORMAL HIGH (ref 0.44–1.00)
GFR, Estimated: 37 mL/min — ABNORMAL LOW (ref >60)
Glucose, Bld: 149 mg/dL — ABNORMAL HIGH (ref 70–99)
Potassium: 5 mmol/L (ref 3.5–5.1)
Sodium: 135 mmol/L (ref 135–145)

## 2020-10-12 LAB — VITAMIN D 25 HYDROXY (VIT D DEFICIENCY, FRACTURES): Vit D, 25-Hydroxy: 28.19 ng/mL — ABNORMAL LOW (ref 30–100)

## 2020-10-12 MED ORDER — ALBUTEROL SULFATE (2.5 MG/3ML) 0.083% IN NEBU: 2.5000 mg | INHALATION_SOLUTION | RESPIRATORY_TRACT | Status: DC | PRN

## 2020-10-12 NOTE — Progress Notes (Signed)
Pt attempted to void through the night. Pt unable to void. Pt scanned, showed approximately 400 CC, I/O cath with 600 CC of urine collect. Pt tolerated procedure well. No further c/o discomfort. Skeet Simmer 10/12/20 3:15 AM

## 2020-10-12 NOTE — Anesthesia Postprocedure Evaluation (Signed)
Anesthesia Post Note  Patient: Sharon Reed  Procedure(s) Performed: OPEN REDUCTION INTERNAL FIXATION (ORIF) RADIAL FRACTURE (Right) OPEN REDUCTION INTERNAL FIXATION (ORIF) TIBIAL PLATEAU (Right)     Patient location during evaluation: PACU Anesthesia Type: General Level of consciousness: awake and alert Pain management: pain level controlled Vital Signs Assessment: post-procedure vital signs reviewed and stable Respiratory status: spontaneous breathing, nonlabored ventilation, respiratory function stable and patient connected to nasal cannula oxygen Cardiovascular status: blood pressure returned to baseline and stable Postop Assessment: no apparent nausea or vomiting Anesthetic complications: no   No notable events documented.  Shelton Silvas
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
## 2020-10-12 NOTE — Progress Notes (Signed)
Orthopaedic Trauma Progress Note  SUBJECTIVE: Doing fairly well this morning.  Resting comfortably.  Pain overall controlled.  Daughter at bedside.    OBJECTIVE:  Vitals:   10/12/20 0000 10/12/20 0624  BP: (!) 113/57 (!) 106/53  Pulse: 72 86  Resp: 18 15  Temp: 98.2 F (36.8 C) 97.9 F (36.6 C)  SpO2: 98% 90%    General: Resting in bed comfortably, no acute distress Respiratory: No increased work of breathing.  Right lower extremity: Dressing clean, dry, intact.  Able to wiggle toes.  Endorses sensation throughout extremity.  Discomfort with knee motion as expected.  Compartments soft and compressible.  Foot warm and well-perfused. Right upper extremity: Short arm volar splint in place.  Nontender above the splint.  Able to wiggle fingers.  Hand warm and well-perfused.  IMAGING: Stable post op imaging.   LABS:  Results for orders placed or performed during the hospital encounter of 10/09/20 (from the past 24 hour(s))  Surgical PCR screen     Status: None   Collection Time: 10/11/20  8:17 AM   Specimen: Nasal Mucosa; Nasal Swab  Result Value Ref Range   MRSA, PCR NEGATIVE NEGATIVE   Staphylococcus aureus NEGATIVE NEGATIVE  Basic metabolic panel     Status: Abnormal   Collection Time: 10/12/20  3:20 AM  Result Value Ref Range   Sodium 135 135 - 145 mmol/L   Potassium 5.0 3.5 - 5.1 mmol/L   Chloride 100 98 - 111 mmol/L   CO2 27 22 - 32 mmol/L   Glucose, Bld 149 (H) 70 - 99 mg/dL   BUN 43 (H) 8 - 23 mg/dL   Creatinine, Ser 7.62 (H) 0.44 - 1.00 mg/dL   Calcium 8.9 8.9 - 83.1 mg/dL   GFR, Estimated 37 (L) >60 mL/min   Anion gap 8 5 - 15  CBC     Status: Abnormal   Collection Time: 10/12/20  3:20 AM  Result Value Ref Range   WBC 12.7 (H) 4.0 - 10.5 K/uL   RBC 3.49 (L) 3.87 - 5.11 MIL/uL   Hemoglobin 10.5 (L) 12.0 - 15.0 g/dL   HCT 51.7 (L) 61.6 - 07.3 %   MCV 89.4 80.0 - 100.0 fL   MCH 30.1 26.0 - 34.0 pg   MCHC 33.7 30.0 - 36.0 g/dL   RDW 71.0 62.6 - 94.8 %    Platelets 160 150 - 400 K/uL   nRBC 0.0 0.0 - 0.2 %    ASSESSMENT: Sharon Reed is a 87 y.o. female, 1 Day Post-Op s/p ORIF RIGHT RADIAL FRACTURE ORIF RIGHT TIBIAL PLATEAU  CV/Blood loss: Acute blood loss anemia, Hgb 10.5 this AM. BP soft but overall hemodynamically stable  PLAN: Weightbearing:  - RUE: WBAT through elbow, NWB through wrist - RLE: WBAT Incisional and dressing care:  - RUE: Maintain splint till follow-up - RLE: Plan to remove dressing tomorrow  Showering: Hold off for now Orthopedic device(s): Splint RUE Pain management:  1. Tylenol 1000 mg 3 times daily 2. Norco 5-325 mg q 4 hours PRN for moderate pain 3. Oxycodone 5 mg q 4 hours PRN for severe pain 4.  Morphine 0.5 mg every 2 hours as needed breakthrough pain VTE prophylaxis: Lovenox, SCDs ID:  Ancef 2gm post op Foley/Lines:  No foley, KVO IVFs Impediments to Fracture Healing: Vitamin D level pending, will start supplementation as indicated. Dispo: PT/OT evaluation today, dispo pending.  Plan to remove dressing to right lower extremity tomorrow.   Follow - up plan: 2  weeks after discharge for repeat x-rays right upper and lower extremity and wound check.  Contact information:  Truitt Merle MD, Ulyses Southward PA-C. After hours and holidays please check [email or web address] for group call information for [facility]   Sharon Lacko A. Michaelyn Barter, PA-C [phone] (office) [email or web address]
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
## 2020-10-13 ENCOUNTER — Encounter (HOSPITAL_COMMUNITY): Payer: Self-pay | Admitting: Student

## 2020-10-13 DIAGNOSIS — T07XXXA Unspecified multiple injuries, initial encounter: Secondary | ICD-10-CM | POA: Diagnosis not present

## 2020-10-13 DIAGNOSIS — I10 Essential (primary) hypertension: Secondary | ICD-10-CM | POA: Diagnosis not present

## 2020-10-13 LAB — BASIC METABOLIC PANEL
Anion gap: 8 (ref 5–15)
BUN: 48 mg/dL — ABNORMAL HIGH (ref 8–23)
CO2: 28 mmol/L (ref 22–32)
Calcium: 8.6 mg/dL — ABNORMAL LOW (ref 8.9–10.3)
Chloride: 98 mmol/L (ref 98–111)
Creatinine, Ser: 1.23 mg/dL — ABNORMAL HIGH (ref 0.44–1.00)
GFR, Estimated: 42 mL/min — ABNORMAL LOW (ref >60)
Glucose, Bld: 118 mg/dL — ABNORMAL HIGH (ref 70–99)
Potassium: 4.4 mmol/L (ref 3.5–5.1)
Sodium: 134 mmol/L — ABNORMAL LOW (ref 135–145)

## 2020-10-13 LAB — CBC
HCT: 25.8 % — ABNORMAL LOW (ref 36.0–46.0)
Hemoglobin: 8.6 g/dL — ABNORMAL LOW (ref 12.0–15.0)
MCH: 29.9 pg (ref 26.0–34.0)
MCHC: 33.3 g/dL (ref 30.0–36.0)
MCV: 89.6 fL (ref 80.0–100.0)
Platelets: 189 10*3/uL (ref 150–400)
RBC: 2.88 MIL/uL — ABNORMAL LOW (ref 3.87–5.11)
RDW: 13.2 % (ref 11.5–15.5)
WBC: 12 10*3/uL — ABNORMAL HIGH (ref 4.0–10.5)
nRBC: 0 % (ref 0.0–0.2)

## 2020-10-13 MED ORDER — ACETAMINOPHEN 500 MG PO TABS
1000.0000 mg | ORAL_TABLET | Freq: Three times a day (TID) | ORAL | Status: DC
  Administered 2020-10-13 – 2020-10-14 (×3): 1000 mg via ORAL
  Filled 2020-10-13 (×3): qty 2

## 2020-10-13 MED ORDER — OXYCODONE HCL 5 MG PO TABS: 5.0000 mg | ORAL_TABLET | ORAL | 0 refills | Status: AC | PRN

## 2020-10-13 MED ORDER — OXYCODONE HCL 5 MG PO TABS: 2.5000 mg | ORAL_TABLET | ORAL | Status: DC | PRN

## 2020-10-13 MED ORDER — VITAMIN D 25 MCG (1000 UNIT) PO TABS
1000.0000 [IU] | ORAL_TABLET | Freq: Every day | ORAL | Status: DC
  Administered 2020-10-13 – 2020-10-14 (×2): 1000 [IU] via ORAL
  Filled 2020-10-13 (×2): qty 1

## 2020-10-13 MED ORDER — VITAMIN D3 25 MCG PO TABS: 1000.0000 [IU] | ORAL_TABLET | Freq: Every day | ORAL | 0 refills | Status: AC

## 2020-10-13 MED ORDER — ASPIRIN EC 325 MG PO TBEC: 325.0000 mg | DELAYED_RELEASE_TABLET | Freq: Two times a day (BID) | ORAL | 0 refills | Status: AC
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
## 2020-10-14 DIAGNOSIS — T07XXXA Unspecified multiple injuries, initial encounter: Secondary | ICD-10-CM | POA: Diagnosis not present

## 2020-10-14 LAB — BASIC METABOLIC PANEL
Anion gap: 9 (ref 5–15)
BUN: 38 mg/dL — ABNORMAL HIGH (ref 8–23)
CO2: 27 mmol/L (ref 22–32)
Calcium: 8.5 mg/dL — ABNORMAL LOW (ref 8.9–10.3)
Chloride: 99 mmol/L (ref 98–111)
Creatinine, Ser: 1 mg/dL (ref 0.44–1.00)
GFR, Estimated: 54 mL/min — ABNORMAL LOW (ref >60)
Glucose, Bld: 98 mg/dL (ref 70–99)
Potassium: 4.4 mmol/L (ref 3.5–5.1)
Sodium: 135 mmol/L (ref 135–145)

## 2020-10-14 LAB — CBC
HCT: 23.3 % — ABNORMAL LOW (ref 36.0–46.0)
Hemoglobin: 7.6 g/dL — ABNORMAL LOW (ref 12.0–15.0)
MCH: 29.5 pg (ref 26.0–34.0)
MCHC: 32.6 g/dL (ref 30.0–36.0)
MCV: 90.3 fL (ref 80.0–100.0)
Platelets: 212 10*3/uL (ref 150–400)
RBC: 2.58 MIL/uL — ABNORMAL LOW (ref 3.87–5.11)
RDW: 13.2 % (ref 11.5–15.5)
WBC: 7.4 10*3/uL (ref 4.0–10.5)
nRBC: 0.4 % — ABNORMAL HIGH (ref 0.0–0.2)

## 2020-10-14 MED ORDER — METOCLOPRAMIDE HCL 5 MG PO TABS: 5.0000 mg | ORAL_TABLET | Freq: Three times a day (TID) | ORAL | 0 refills | Status: AC
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
## 2020-12-30 ENCOUNTER — Encounter (HOSPITAL_COMMUNITY): Payer: Self-pay

## 2020-12-30 ENCOUNTER — Other Ambulatory Visit: Payer: Self-pay

## 2020-12-30 ENCOUNTER — Emergency Department (HOSPITAL_COMMUNITY): Payer: Medicaid Other

## 2020-12-30 ENCOUNTER — Inpatient Hospital Stay (HOSPITAL_COMMUNITY)
Admission: EM | Admit: 2020-12-30 | Discharge: 2021-01-28 | DRG: 871 | Disposition: E | Payer: Medicaid Other | Source: Ambulatory Visit | Attending: Internal Medicine | Admitting: Internal Medicine

## 2020-12-30 DIAGNOSIS — R0602 Shortness of breath: Secondary | ICD-10-CM

## 2020-12-30 DIAGNOSIS — R0902 Hypoxemia: Secondary | ICD-10-CM | POA: Diagnosis not present

## 2020-12-30 DIAGNOSIS — Z880 Allergy status to penicillin: Secondary | ICD-10-CM | POA: Diagnosis not present

## 2020-12-30 DIAGNOSIS — E440 Moderate protein-calorie malnutrition: Secondary | ICD-10-CM | POA: Diagnosis present

## 2020-12-30 DIAGNOSIS — F0390 Unspecified dementia without behavioral disturbance: Secondary | ICD-10-CM | POA: Diagnosis present

## 2020-12-30 DIAGNOSIS — Z7951 Long term (current) use of inhaled steroids: Secondary | ICD-10-CM

## 2020-12-30 DIAGNOSIS — I10 Essential (primary) hypertension: Secondary | ICD-10-CM | POA: Diagnosis present

## 2020-12-30 DIAGNOSIS — Z20822 Contact with and (suspected) exposure to covid-19: Secondary | ICD-10-CM | POA: Diagnosis present

## 2020-12-30 DIAGNOSIS — J80 Acute respiratory distress syndrome: Secondary | ICD-10-CM | POA: Diagnosis present

## 2020-12-30 DIAGNOSIS — Z79899 Other long term (current) drug therapy: Secondary | ICD-10-CM | POA: Diagnosis not present

## 2020-12-30 DIAGNOSIS — J129 Viral pneumonia, unspecified: Secondary | ICD-10-CM | POA: Diagnosis present

## 2020-12-30 DIAGNOSIS — A4189 Other specified sepsis: Principal | ICD-10-CM | POA: Diagnosis present

## 2020-12-30 DIAGNOSIS — R54 Age-related physical debility: Secondary | ICD-10-CM | POA: Diagnosis present

## 2020-12-30 DIAGNOSIS — Z66 Do not resuscitate: Secondary | ICD-10-CM | POA: Diagnosis present

## 2020-12-30 DIAGNOSIS — Z681 Body mass index (BMI) 19 or less, adult: Secondary | ICD-10-CM | POA: Diagnosis not present

## 2020-12-30 DIAGNOSIS — J101 Influenza due to other identified influenza virus with other respiratory manifestations: Secondary | ICD-10-CM | POA: Diagnosis not present

## 2020-12-30 DIAGNOSIS — Z515 Encounter for palliative care: Secondary | ICD-10-CM | POA: Diagnosis not present

## 2020-12-30 DIAGNOSIS — R7989 Other specified abnormal findings of blood chemistry: Secondary | ICD-10-CM | POA: Diagnosis present

## 2020-12-30 DIAGNOSIS — E871 Hypo-osmolality and hyponatremia: Secondary | ICD-10-CM | POA: Diagnosis present

## 2020-12-30 DIAGNOSIS — J1008 Influenza due to other identified influenza virus with other specified pneumonia: Secondary | ICD-10-CM | POA: Diagnosis present

## 2020-12-30 DIAGNOSIS — J441 Chronic obstructive pulmonary disease with (acute) exacerbation: Secondary | ICD-10-CM | POA: Diagnosis present

## 2020-12-30 DIAGNOSIS — J440 Chronic obstructive pulmonary disease with acute lower respiratory infection: Secondary | ICD-10-CM | POA: Diagnosis present

## 2020-12-30 DIAGNOSIS — E860 Dehydration: Secondary | ICD-10-CM | POA: Diagnosis present

## 2020-12-30 LAB — CBC WITH DIFFERENTIAL/PLATELET
Abs Immature Granulocytes: 0 10*3/uL (ref 0.00–0.07)
Basophils Absolute: 0 10*3/uL (ref 0.0–0.1)
Basophils Relative: 0 %
Eosinophils Absolute: 0 10*3/uL (ref 0.0–0.5)
Eosinophils Relative: 0 %
HCT: 40.9 % (ref 36.0–46.0)
Hemoglobin: 13.3 g/dL (ref 12.0–15.0)
Lymphocytes Relative: 1 %
Lymphs Abs: 0.1 10*3/uL — ABNORMAL LOW (ref 0.7–4.0)
MCH: 29.1 pg (ref 26.0–34.0)
MCHC: 32.5 g/dL (ref 30.0–36.0)
MCV: 89.5 fL (ref 80.0–100.0)
Monocytes Absolute: 0.1 10*3/uL (ref 0.1–1.0)
Monocytes Relative: 1 %
Neutro Abs: 11.1 10*3/uL — ABNORMAL HIGH (ref 1.7–7.7)
Neutrophils Relative %: 98 %
Platelets: 212 10*3/uL (ref 150–400)
RBC: 4.57 MIL/uL (ref 3.87–5.11)
RDW: 13.5 % (ref 11.5–15.5)
WBC: 11.3 10*3/uL — ABNORMAL HIGH (ref 4.0–10.5)
nRBC: 0 % (ref 0.0–0.2)
nRBC: 0 /100 WBC

## 2020-12-30 LAB — BASIC METABOLIC PANEL
Anion gap: 10 (ref 5–15)
BUN: 21 mg/dL (ref 8–23)
CO2: 21 mmol/L — ABNORMAL LOW (ref 22–32)
Calcium: 8.8 mg/dL — ABNORMAL LOW (ref 8.9–10.3)
Chloride: 97 mmol/L — ABNORMAL LOW (ref 98–111)
Creatinine, Ser: 1.04 mg/dL — ABNORMAL HIGH (ref 0.44–1.00)
GFR, Estimated: 51 mL/min — ABNORMAL LOW (ref >60)
Glucose, Bld: 117 mg/dL — ABNORMAL HIGH (ref 70–99)
Potassium: 4.6 mmol/L (ref 3.5–5.1)
Sodium: 128 mmol/L — ABNORMAL LOW (ref 135–145)

## 2020-12-30 LAB — I-STAT VENOUS BLOOD GAS, ED
Acid-base deficit: 1 mmol/L (ref 0.0–2.0)
Bicarbonate: 23.3 mmol/L (ref 20.0–28.0)
Calcium, Ion: 0.97 mmol/L — ABNORMAL LOW (ref 1.15–1.40)
HCT: 41 % (ref 36.0–46.0)
Hemoglobin: 13.9 g/dL (ref 12.0–15.0)
O2 Saturation: 79 %
Potassium: 5.1 mmol/L (ref 3.5–5.1)
Sodium: 127 mmol/L — ABNORMAL LOW (ref 135–145)
TCO2: 24 mmol/L (ref 22–32)
pCO2, Ven: 36.6 mmHg — ABNORMAL LOW (ref 44.0–60.0)
pH, Ven: 7.413 (ref 7.250–7.430)
pO2, Ven: 43 mmHg (ref 32.0–45.0)

## 2020-12-30 LAB — RESP PANEL BY RT-PCR (FLU A&B, COVID) ARPGX2
Influenza A by PCR: POSITIVE — AB
Influenza B by PCR: NEGATIVE
SARS Coronavirus 2 by RT PCR: NEGATIVE

## 2020-12-30 LAB — LACTIC ACID, PLASMA: Lactic Acid, Venous: 2.2 mmol/L (ref 0.5–1.9)

## 2020-12-30 MED ORDER — ACETAMINOPHEN 500 MG PO TABS
1000.0000 mg | ORAL_TABLET | Freq: Four times a day (QID) | ORAL | Status: DC | PRN
  Administered 2020-12-31 – 2021-01-02 (×2): 1000 mg via ORAL
  Filled 2020-12-30: qty 2

## 2020-12-30 MED ORDER — METOPROLOL TARTRATE 50 MG PO TABS
100.0000 mg | ORAL_TABLET | Freq: Two times a day (BID) | ORAL | Status: DC
  Administered 2020-12-30 – 2020-12-31 (×3): 100 mg via ORAL
  Filled 2020-12-30: qty 4
  Filled 2020-12-30: qty 2
  Filled 2020-12-30 (×2): qty 4

## 2020-12-30 MED ORDER — BUDESONIDE 0.5 MG/2ML IN SUSP
2.0000 mg | Freq: Two times a day (BID) | RESPIRATORY_TRACT | Status: DC
  Administered 2020-12-30 – 2020-12-31 (×2): 2 mg via RESPIRATORY_TRACT
  Filled 2020-12-30 (×3): qty 8

## 2020-12-30 MED ORDER — ENOXAPARIN SODIUM 30 MG/0.3ML IJ SOSY
30.0000 mg | PREFILLED_SYRINGE | INTRAMUSCULAR | Status: DC
  Administered 2020-12-30 – 2020-12-31 (×2): 30 mg via SUBCUTANEOUS
  Filled 2020-12-30 (×2): qty 0.3

## 2020-12-30 MED ORDER — LOSARTAN POTASSIUM 50 MG PO TABS
50.0000 mg | ORAL_TABLET | Freq: Every day | ORAL | Status: DC
  Administered 2020-12-30: 50 mg via ORAL
  Filled 2020-12-30: qty 1

## 2020-12-30 MED ORDER — PREDNISONE 20 MG PO TABS
40.0000 mg | ORAL_TABLET | Freq: Every day | ORAL | Status: DC
  Administered 2020-12-31: 09:00:00 40 mg via ORAL
  Filled 2020-12-30: qty 2

## 2020-12-30 MED ORDER — IPRATROPIUM-ALBUTEROL 0.5-2.5 (3) MG/3ML IN SOLN
3.0000 mL | Freq: Four times a day (QID) | RESPIRATORY_TRACT | Status: DC
  Administered 2020-12-30 – 2020-12-31 (×4): 3 mL via RESPIRATORY_TRACT
  Filled 2020-12-30 (×4): qty 3

## 2020-12-30 MED ORDER — GUAIFENESIN ER 600 MG PO TB12
1200.0000 mg | ORAL_TABLET | Freq: Two times a day (BID) | ORAL | Status: DC
  Administered 2020-12-30 – 2020-12-31 (×3): 1200 mg via ORAL
  Filled 2020-12-30 (×4): qty 2

## 2020-12-30 MED ORDER — MOMETASONE FURO-FORMOTEROL FUM 200-5 MCG/ACT IN AERO
2.0000 | INHALATION_SPRAY | Freq: Two times a day (BID) | RESPIRATORY_TRACT | Status: DC
  Administered 2020-12-30 – 2020-12-31 (×3): 2 via RESPIRATORY_TRACT
  Filled 2020-12-30: qty 8.8

## 2020-12-30 MED ORDER — SODIUM CHLORIDE 0.9 % IV BOLUS
500.0000 mL | Freq: Once | INTRAVENOUS | Status: AC
  Administered 2020-12-30: 500 mL via INTRAVENOUS

## 2020-12-30 MED ORDER — OSELTAMIVIR PHOSPHATE 30 MG PO CAPS
30.0000 mg | ORAL_CAPSULE | ORAL | Status: DC
  Administered 2020-12-30 – 2020-12-31 (×2): 30 mg via ORAL
  Filled 2020-12-30 (×4): qty 1

## 2020-12-30 MED ORDER — BENZONATATE 100 MG PO CAPS
100.0000 mg | ORAL_CAPSULE | Freq: Two times a day (BID) | ORAL | Status: DC
  Administered 2020-12-30 – 2020-12-31 (×3): 100 mg via ORAL
  Filled 2020-12-30 (×3): qty 1

## 2020-12-30 NOTE — Progress Notes (Signed)
RT placed patient on 6L [place] HFNC to help with increased WOB. Patient is tolerating settings well at this time. RT will monitor as needed.

## 2020-12-30 NOTE — ED Notes (Signed)
Contacted resp reference pt resp rate, and lung sounds. Audible congestion noted, congestion noted through out bilat. Resp at bedside at this time

## 2020-12-30 NOTE — ED Notes (Signed)
Received verbal report from Tara D RN at this time 

## 2020-12-30 NOTE — ED Notes (Signed)
Admit provider paged at this time

## 2020-12-30 NOTE — Progress Notes (Signed)
RT gave breathing tx and ordered flutter valve per pt assessment. Pt did flutter x5 with good effort. Weak unproductive cough noted. Currently on 3L [place], spo2 98%, RR 26-30. RT will continue to monitor as needed.

## 2020-12-30 NOTE — ED Triage Notes (Signed)
[facility] reports pt coming from [facility] for +Flu. Pt O2 was 86% at UC. Pt was placed on 4L [place]. UC gave 1g Tylenol @1414  for fever of 101.2, 4mg  Zofran ODT. Pt has been having sore throat, weakness and cough for the past 4 days. Pt does not speak english, daughter at bedside does speak english.

## 2020-12-30 NOTE — ED Provider Notes (Signed)
[facility] EMERGENCY DEPARTMENT Provider Note   CSN: [id number] Arrival date & time: 12/31/2020  1516     History Chief Complaint  Patient presents with   Shortness of Breath    Sharon Reed is a 87 y.o. female.  The history is provided by the patient and medical records. A language interpreter was used.  Shortness of Breath Severity:  Severe Onset quality:  Gradual Duration:  4 days Timing:  Constant Progression:  Worsening Context: URI (known influenza A positive)   Relieved by:  Nothing Worsened by:  Nothing Associated symptoms: cough and fever   Associated symptoms: no abdominal pain, no chest pain, no ear pain, no rash, no sore throat and no vomiting       Past Medical History:  Diagnosis Date   Dementia ([place])    Hypertension     Patient Active Problem List   Diagnosis Date Noted   Influenza A 01/13/2021   Hypoxia 12/28/2020   Tibia fracture 10/10/2020   Multiple fractures 10/09/2020    Past Surgical History:  Procedure Laterality Date   ORIF RADIAL FRACTURE Right 10/11/2020   Procedure: OPEN REDUCTION INTERNAL FIXATION (ORIF) RADIAL FRACTURE;  Surgeon: Shona Needles, MD;  Location: [place];  Service: Orthopedics;  Laterality: Right;   ORIF TIBIA PLATEAU Right 10/11/2020   Procedure: OPEN REDUCTION INTERNAL FIXATION (ORIF) TIBIAL PLATEAU;  Surgeon: Shona Needles, MD;  Location: [place];  Service: Orthopedics;  Laterality: Right;     OB History   No obstetric history on file.     History reviewed. No pertinent family history.  Social History   Tobacco Use   Smoking status: Never   Smokeless tobacco: Never  Substance Use Topics   Alcohol use: Not Currently   Drug use: Not Currently    Home Medications Prior to Admission medications   Medication Sig Start Date End Date Taking? Authorizing Provider  acetaminophen (TYLENOL) 500 MG tablet Take 1,000 mg by mouth every 6 (six) hours as needed for mild pain.    [provider]   benzonatate (TESSALON) 100 MG capsule Take 1 capsule (100 mg total) by mouth 3 (three) times daily as needed for cough. Patient not taking: Reported on 10/09/2020 07/02/20   Quintella Reichert, MD  cholecalciferol (VITAMIN D) 25 MCG tablet Take 1 tablet (1,000 Units total) by mouth daily. 10/13/20 01/11/21  Delray Alt, PA-C  losartan (COZAAR) 50 MG tablet Take 50 mg by mouth daily.    [provider]  metoCLOPramide (REGLAN) 5 MG tablet Take 1-2 tablets (5-10 mg total) by mouth 3 (three) times daily before meals for 20 days. 10/14/20 11/03/20  Nita Sells, MD  metoprolol tartrate (LOPRESSOR) 100 MG tablet Take 100 mg by mouth 2 (two) times daily.    [provider]  oxyCODONE (OXY IR/ROXICODONE) 5 MG immediate release tablet Take 1 tablet (5 mg total) by mouth every 4 (four) hours as needed for severe pain. 10/13/20   Delray Alt, PA-C    Allergies    Penicillins  Review of Systems   Review of Systems  Constitutional:  Positive for appetite change, chills, fatigue and fever.  HENT:  Positive for congestion and rhinorrhea. Negative for ear pain and sore throat.   Eyes:  Negative for pain and visual disturbance.  Respiratory:  Positive for cough and shortness of breath.   Cardiovascular:  Negative for chest pain and palpitations.  Gastrointestinal:  Positive for nausea. Negative for abdominal pain and vomiting.  Genitourinary:  Negative for dysuria and hematuria.  Musculoskeletal:  Negative for arthralgias and back pain.  Skin:  Negative for color change and rash.  Neurological:  Negative for seizures and syncope.  All other systems reviewed and are negative.  Physical Exam Updated Vital Signs BP 116/69   Pulse (!) 101   Temp 98.7 F (37.1 C) (Oral)   Resp (!) 26   Ht 5' (1.524 m)   Wt 45.4 kg   SpO2 98%   BMI 19.53 kg/m   Physical Exam Vitals and nursing note reviewed.  Constitutional:      General: She is in acute distress (mild respiratory  distress, requiring O2).     Appearance: Normal appearance. She is well-developed.  HENT:     Head: Normocephalic and atraumatic.     Right Ear: External ear normal.     Left Ear: External ear normal.     Nose: Nose normal. No congestion or rhinorrhea.     Mouth/Throat:     Mouth: Mucous membranes are moist.  Eyes:     Extraocular Movements: Extraocular movements intact.     Conjunctiva/sclera: Conjunctivae normal.     Pupils: Pupils are equal, round, and reactive to light.  Cardiovascular:     Rate and Rhythm: Regular rhythm. Tachycardia present.     Pulses: Normal pulses.     Heart sounds: No murmur heard. Pulmonary:     Effort: Tachypnea (Coarse bilateral breath sounds) and respiratory distress (Mild respiratory distress) present.     Breath sounds: No wheezing, rhonchi or rales.  Abdominal:     General: Abdomen is flat. Bowel sounds are normal.     Palpations: Abdomen is soft.     Tenderness: There is no abdominal tenderness. There is no guarding or rebound.  Musculoskeletal:        General: No swelling, tenderness or deformity.     Cervical back: Normal range of motion and neck supple. No rigidity.  Skin:    General: Skin is warm and dry.     Capillary Refill: Capillary refill takes less than 2 seconds.     Findings: Erythema present.  Neurological:     General: No focal deficit present.     Mental Status: She is alert and oriented to person, place, and time.  Psychiatric:        Mood and Affect: Mood normal.    ED Results / Procedures / Treatments   Labs (all labs ordered are listed, but only abnormal results are displayed) Labs Reviewed  RESP PANEL BY RT-PCR (FLU A&B, COVID) ARPGX2 - Abnormal; Notable for the following components:      Result Value   Influenza A by PCR POSITIVE (*)    All other components within normal limits  CBC WITH DIFFERENTIAL/PLATELET - Abnormal; Notable for the following components:   WBC 11.3 (*)    Neutro Abs 11.1 (*)    Lymphs Abs 0.1  (*)    All other components within normal limits  BASIC METABOLIC PANEL - Abnormal; Notable for the following components:   Sodium 128 (*)    Chloride 97 (*)    CO2 21 (*)    Glucose, Bld 117 (*)    Creatinine, Ser 1.04 (*)    Calcium 8.8 (*)    GFR, Estimated 51 (*)    All other components within normal limits  LACTIC ACID, PLASMA - Abnormal; Notable for the following components:   Lactic Acid, Venous 2.2 (*)    All other components  within normal limits  I-STAT VENOUS BLOOD GAS, ED - Abnormal; Notable for the following components:   pCO2, Ven 36.6 (*)    Sodium 127 (*)    Calcium, Ion 0.97 (*)    All other components within normal limits  BLOOD GAS, VENOUS  LACTIC ACID, PLASMA  BASIC METABOLIC PANEL    EKG EKG Interpretation  Date/Time:  Saturday December 30 2020 15:37:24 EST Ventricular Rate:  88 PR Interval:  179 QRS Duration: 153 QT Interval:  399 QTC Calculation: 483 R Axis:   64 Text Interpretation: Sinus rhythm Consider right atrial enlargement Significant artifact, will repeat. No STEMI Confirmed by Antony Blackbird [phone]) on 01/06/2021 3:42:09 PM  Radiology DG Chest Portable 1 View  Result Date: 01/25/2021 CLINICAL DATA:  Cough, flu positive. EXAM: PORTABLE CHEST 1 VIEW COMPARISON:  October 09, 2020 FINDINGS: Cardiomediastinal silhouette is normal. Mediastinal contours appear intact. Mild peribronchial airspace consolidation the left lower lobe. Osseous structures are without acute abnormality. Soft tissues are grossly normal. IMPRESSION: Mild peribronchial airspace consolidation the left lower lobe may represent early atypical pneumonia. Electronically Signed   By: Fidela Salisbury M.D.   On: 01/25/2021 16:23    Procedures Procedures   Medications Ordered in ED Medications  acetaminophen (TYLENOL) tablet 1,000 mg (has no administration in time range)  losartan (COZAAR) tablet 50 mg (50 mg Oral Given 01/22/2021 1937)  metoprolol tartrate (LOPRESSOR) tablet 100  mg (100 mg Oral Given 01/07/2021 2218)  enoxaparin (LOVENOX) injection 30 mg (30 mg Subcutaneous Given 12/28/2020 1936)  oseltamivir (TAMIFLU) capsule 30 mg (30 mg Oral Given 01/23/2021 1756)  budesonide (PULMICORT) nebulizer solution 2 mg (2 mg Nebulization Given 01/16/2021 2207)  predniSONE (DELTASONE) tablet 40 mg (has no administration in time range)  mometasone-formoterol (DULERA) 200-5 MCG/ACT inhaler 2 puff (2 puffs Inhalation Given 01/11/2021 1937)  ipratropium-albuterol (DUONEB) 0.5-2.5 (3) MG/3ML nebulizer solution 3 mL (3 mLs Nebulization Given 01/05/2021 1939)  guaiFENesin (MUCINEX) 12 hr tablet 1,200 mg (1,200 mg Oral Given 12/29/2020 2217)  benzonatate (TESSALON) capsule 100 mg (100 mg Oral Given 01/10/2021 2217)  sodium chloride 0.9 % bolus 500 mL (0 mLs Intravenous Stopped 01/24/2021 2207)    ED Course  I have reviewed the triage vital signs and the nursing notes.  Pertinent labs & imaging results that were available during my care of the patient were reviewed by me and considered in my medical decision making (see chart for details).    MDM Rules/Calculators/A&P                          This is a 87 year old female with history of hypertension, asthma, presenting with viral URI symptoms for 4 days.  Found to be flu positive at urgent care.  She was tachypneic, tachycardic, hypoxic requiring 4 L.  She was transferred here for further evaluation.  Coarse breath sounds on exam.  Low concern for PE in the setting of URI symptoms, no leg swelling, no other provocable factors or hx of VTE.  She was given Tamiflu.  Chest x-ray showed no focal infiltrates.  Basic labs obtained.  Electrolytes reassuring.  Creatinine at baseline.  Will admit for further evaluation management of acute hypoxic respiratory failure in the setting of influenza.  Medicine team contacted for admission.  Handoff given.  Final Clinical Impression(s) / ED Diagnoses Final diagnoses:  Hypoxia  SOB (shortness of breath)  Influenza A     Rx / DC Orders ED Discharge Orders  None        Lutricia Feil, MD 01/12/2021 [id number]    Tegeler, Canary Brim, MD 01/25/2021 [phone]

## 2020-12-30 NOTE — H&P (Addendum)
History and Physical    Sharon Reed [id number] DOB: 1928/01/10 DOA: January 21, 2021  PCP: Harlen Labs, NP (Confirm with patient/family/NH records and if not entered, this has to be entered at [facility] point of entry) Patient coming from: Home  I have personally briefly reviewed patient's old medical records in [facility]  Chief Complaint: Cough, fever, SOB  HPI: Sharon Reed is a 87 y.o. female with medical history significant of COPD, HTN, dementia came with 4 days of cough, wheezing, SOB and fever.  Patient is baseline demented and all history was provided by daughter at bedside. Patient's symptoms started 4 days ago with a new cough, initially dry then became productive with clear plegm. She also has had low fever every day with 100-101 F, and clearly wheezing. Family has  been using around clock Duoneb nebulizer with little help. Today at urgent care, patient was found fever of 101.2 and hypoxia 86% on RA and Flu A positive and sent to ED.  ED Course: O2 sat 81% on RA, stabilized on 4 liters. Chest xray showed B/L atypical interstitial infiltrates. WBC 11.3  Review of Systems: Unable to perform, baseline dementia  Past Medical History:  Diagnosis Date   Dementia ([facility])    Hypertension     Past Surgical History:  Procedure Laterality Date   ORIF RADIAL FRACTURE Right 10/11/2020   Procedure: OPEN REDUCTION INTERNAL FIXATION (ORIF) RADIAL FRACTURE;  Surgeon: Roby Lofts, MD;  Location: [facility];  Service: Orthopedics;  Laterality: Right;   ORIF TIBIA PLATEAU Right 10/11/2020   Procedure: OPEN REDUCTION INTERNAL FIXATION (ORIF) TIBIAL PLATEAU;  Surgeon: Roby Lofts, MD;  Location: [facility] OR;  Service: Orthopedics;  Laterality: Right;     reports that she has never smoked. She has never used smokeless tobacco. She reports that she does not currently use alcohol. She reports that she does not currently use drugs.  Allergies  Allergen Reactions   Penicillins Other (See Comments)     Weakness, fainting    History reviewed. No pertinent family history.   Prior to Admission medications   Medication Sig Start Date End Date Taking? Authorizing Provider  acetaminophen (TYLENOL) 500 MG tablet Take 1,000 mg by mouth every 6 (six) hours as needed for mild pain.    [provider]  benzonatate (TESSALON) 100 MG capsule Take 1 capsule (100 mg total) by mouth 3 (three) times daily as needed for cough. Patient not taking: Reported on 10/09/2020 07/02/20   Tilden Fossa, MD  cholecalciferol (VITAMIN D) 25 MCG tablet Take 1 tablet (1,000 Units total) by mouth daily. 10/13/20 01/11/21  Despina Hidden, PA-C  losartan (COZAAR) 50 MG tablet Take 50 mg by mouth daily.    [provider]  metoCLOPramide (REGLAN) 5 MG tablet Take 1-2 tablets (5-10 mg total) by mouth 3 (three) times daily before meals for 20 days. 10/14/20 11/03/20  Rhetta Mura, MD  metoprolol tartrate (LOPRESSOR) 100 MG tablet Take 100 mg by mouth 2 (two) times daily.    [provider]  oxyCODONE (OXY IR/ROXICODONE) 5 MG immediate release tablet Take 1 tablet (5 mg total) by mouth every 4 (four) hours as needed for severe pain. 10/13/20   Despina Hidden, PA-C    Physical Exam: Vitals:   01-21-2021 1525 21-Jan-2021 1530 01/21/21 1630  BP:  (!) 151/95 118/64  Pulse:  92 80  Resp:  20 20  Temp:  99.4 F (37.4 C)   TempSrc:  Axillary   SpO2:  (!) 81%  97%  Weight: 45.4 kg    Height: 5' (1.524 m)      Constitutional: NAD, calm, comfortable Vitals:   01/01/2021 1525 01/05/2021 1530 01/25/2021 1630  BP:  (!) 151/95 118/64  Pulse:  92 80  Resp:  20 20  Temp:  99.4 F (37.4 C)   TempSrc:  Axillary   SpO2:  (!) 81% 97%  Weight: 45.4 kg    Height: 5' (1.524 m)     Eyes: PERRL, lids and conjunctivae normal ENMT: Mucous membranes are moist. Posterior pharynx clear of any exudate or lesions.Normal dentition.  Neck: normal, supple, no masses, no thyromegaly Respiratory: diminished breathing  sound bilaterally, diffused wheezing, no crackles. Increasing respiratory effort. No accessory muscle use.  Cardiovascular: Regular rate and rhythm, no murmurs / rubs / gallops. No extremity edema. 2+ pedal pulses. No carotid bruits.  Abdomen: no tenderness, no masses palpated. No hepatosplenomegaly. Bowel sounds positive.  Musculoskeletal: no clubbing / cyanosis. No joint deformity upper and lower extremities. Good ROM, no contractures. Normal muscle tone.  Skin: no rashes, lesions, ulcers. No induration Neurologic: CN 2-12 grossly intact. Sensation intact, DTR normal. Strength 5/5 in all 4.  Psychiatric: Demented    Labs on Admission: I have personally reviewed following labs and imaging studies  CBC: Recent Labs  Lab 01/26/2021 1538  WBC 11.3*  NEUTROABS 11.1*  HGB 13.3  HCT 40.9  MCV 89.5  PLT 212   Basic Metabolic Panel: Recent Labs  Lab 01/01/2021 1538  NA 128*  K 4.6  CL 97*  CO2 21*  GLUCOSE 117*  BUN 21  CREATININE 1.04*  CALCIUM 8.8*   GFR: Estimated Creatinine Clearance: 25.3 mL/min (A) (by C-G formula based on SCr of 1.04 mg/dL (H)). Liver Function Tests: No results for input(s): AST, ALT, ALKPHOS, BILITOT, PROT, ALBUMIN in the last 168 hours. No results for input(s): LIPASE, AMYLASE in the last 168 hours. No results for input(s): AMMONIA in the last 168 hours. Coagulation Profile: No results for input(s): INR, PROTIME in the last 168 hours. Cardiac Enzymes: No results for input(s): CKTOTAL, CKMB, CKMBINDEX, TROPONINI in the last 168 hours. BNP (last 3 results) No results for input(s): PROBNP in the last 8760 hours. HbA1C: No results for input(s): HGBA1C in the last 72 hours. CBG: No results for input(s): GLUCAP in the last 168 hours. Lipid Profile: No results for input(s): CHOL, HDL, LDLCALC, TRIG, CHOLHDL, LDLDIRECT in the last 72 hours. Thyroid Function Tests: No results for input(s): TSH, T4TOTAL, FREET4, T3FREE, THYROIDAB in the last 72  hours. Anemia Panel: No results for input(s): VITAMINB12, FOLATE, FERRITIN, TIBC, IRON, RETICCTPCT in the last 72 hours. Urine analysis: No results found for: COLORURINE, APPEARANCEUR, LABSPEC, PHURINE, GLUCOSEU, HGBUR, BILIRUBINUR, KETONESUR, PROTEINUR, UROBILINOGEN, NITRITE, LEUKOCYTESUR  Radiological Exams on Admission: DG Chest Portable 1 View  Result Date: 01/27/2021 CLINICAL DATA:  Cough, flu positive. EXAM: PORTABLE CHEST 1 VIEW COMPARISON:  October 09, 2020 FINDINGS: Cardiomediastinal silhouette is normal. Mediastinal contours appear intact. Mild peribronchial airspace consolidation the left lower lobe. Osseous structures are without acute abnormality. Soft tissues are grossly normal. IMPRESSION: Mild peribronchial airspace consolidation the left lower lobe may represent early atypical pneumonia. Electronically Signed   By: Ted Mcalpine M.D.   On: 01/01/2021 16:23    EKG: Independently reviewed.  ST depressions multiple leads  Assessment/Plan Principal Problem:   Hypoxia Active Problems:   Influenza A  (please populate well all problems here in Problem List. (For example, if patient is on BP meds at  home and you resume or decide to hold them, it is a problem that needs to be her. Same for CAD, COPD, HLD and so on)  Sepsis -Evidenced by fever, new onset of hypoxia and tachypnea, cirrhosis and influenza A viral pneumonia. -Start Tamiflu -Clinically appears euvolemic, will not give boluses point.  Includes a viral pneumonia -Tamiflu  Acute hypoxic respiratory failure  -Likely secondary to viral pneumonia and acute COPD exacerbation -Secondary from FLu infection -Short course of p.o. steroid -Breathing treatment, DuoNeb, inhaled steroid.  Hyponatremia -Baseline Na=134-135, worsening of hyponatremia likely secondary to COPD and influenza A, clinically appears to be euvolemic, will do fluid restriction and recheck Na level tomorrow.  HTN -Resume home BP meds.  DVT  prophylaxis: Lovenox Code Status: Full code Family Communication: Daughter Disposition Plan: Expect 2 to 3 days hospital stay to wean down oxygen and treatment COPD exacerbation. Consults called: None Admission status: Medsurg admit   Emeline General MD [facility] Pager [phone]  01/20/2021, 5:35 PM

## 2020-12-31 ENCOUNTER — Inpatient Hospital Stay (HOSPITAL_COMMUNITY): Payer: Medicaid Other

## 2020-12-31 DIAGNOSIS — R0902 Hypoxemia: Secondary | ICD-10-CM | POA: Diagnosis not present

## 2020-12-31 LAB — BASIC METABOLIC PANEL
Anion gap: 9 (ref 5–15)
BUN: 22 mg/dL (ref 8–23)
CO2: 24 mmol/L (ref 22–32)
Calcium: 8.6 mg/dL — ABNORMAL LOW (ref 8.9–10.3)
Chloride: 96 mmol/L — ABNORMAL LOW (ref 98–111)
Creatinine, Ser: 0.92 mg/dL (ref 0.44–1.00)
GFR, Estimated: 59 mL/min — ABNORMAL LOW (ref >60)
Glucose, Bld: 102 mg/dL — ABNORMAL HIGH (ref 70–99)
Potassium: 4.9 mmol/L (ref 3.5–5.1)
Sodium: 129 mmol/L — ABNORMAL LOW (ref 135–145)

## 2020-12-31 LAB — URINALYSIS, ROUTINE W REFLEX MICROSCOPIC
Bilirubin Urine: NEGATIVE
Glucose, UA: NEGATIVE mg/dL
Ketones, ur: NEGATIVE mg/dL
Nitrite: NEGATIVE
Protein, ur: 100 mg/dL — AB
Specific Gravity, Urine: 1.026 (ref 1.005–1.030)
pH: 5 (ref 5.0–8.0)

## 2020-12-31 LAB — BRAIN NATRIURETIC PEPTIDE: B Natriuretic Peptide: 371.5 pg/mL — ABNORMAL HIGH (ref 0.0–100.0)

## 2020-12-31 LAB — D-DIMER, QUANTITATIVE: D-Dimer, Quant: 3.93 ug/mL-FEU — ABNORMAL HIGH (ref 0.00–0.50)

## 2020-12-31 LAB — MAGNESIUM: Magnesium: 1.6 mg/dL — ABNORMAL LOW (ref 1.7–2.4)

## 2020-12-31 LAB — OSMOLALITY: Osmolality: 277 mOsm/kg (ref 275–295)

## 2020-12-31 LAB — URIC ACID: Uric Acid, Serum: 4.5 mg/dL (ref 2.5–7.1)

## 2020-12-31 LAB — OSMOLALITY, URINE: Osmolality, Ur: 630 mOsm/kg (ref 300–900)

## 2020-12-31 LAB — MRSA NEXT GEN BY PCR, NASAL
MRSA by PCR Next Gen: NOT DETECTED
MRSA by PCR Next Gen: NOT DETECTED

## 2020-12-31 LAB — LACTIC ACID, PLASMA: Lactic Acid, Venous: 2.3 mmol/L (ref 0.5–1.9)

## 2020-12-31 LAB — CREATININE, URINE, RANDOM: Creatinine, Urine: 128.64 mg/dL

## 2020-12-31 LAB — C-REACTIVE PROTEIN: CRP: 17.5 mg/dL — ABNORMAL HIGH (ref <1.0)

## 2020-12-31 LAB — SODIUM, URINE, RANDOM: Sodium, Ur: 15 mmol/L

## 2020-12-31 MED ORDER — PANTOPRAZOLE SODIUM 40 MG PO TBEC
40.0000 mg | DELAYED_RELEASE_TABLET | Freq: Every day | ORAL | Status: DC
  Administered 2020-12-31: 13:00:00 40 mg via ORAL

## 2020-12-31 MED ORDER — IBUPROFEN 400 MG PO TABS
600.0000 mg | ORAL_TABLET | Freq: Once | ORAL | Status: AC
  Administered 2020-12-31: 04:00:00 600 mg via ORAL
  Filled 2020-12-31: qty 1

## 2020-12-31 MED ORDER — DEXAMETHASONE SODIUM PHOSPHATE 10 MG/ML IJ SOLN
10.0000 mg | INTRAMUSCULAR | Status: DC
  Administered 2020-12-31 – 2021-01-01 (×2): 10 mg via INTRAVENOUS
  Filled 2020-12-31 (×2): qty 1

## 2020-12-31 MED ORDER — MAGNESIUM SULFATE 2 GM/50ML IV SOLN
2.0000 g | Freq: Once | INTRAVENOUS | Status: AC
  Administered 2020-12-31: 13:00:00 2 g via INTRAVENOUS
  Filled 2020-12-31: qty 50

## 2020-12-31 MED ORDER — PHENOL 1.4 % MT LIQD
1.0000 | OROMUCOSAL | Status: DC | PRN
  Filled 2020-12-31: qty 177

## 2020-12-31 MED ORDER — LACTATED RINGERS IV SOLN: INTRAVENOUS | Status: DC

## 2020-12-31 MED ORDER — AMLODIPINE BESYLATE 10 MG PO TABS
10.0000 mg | ORAL_TABLET | Freq: Every day | ORAL | Status: DC
  Administered 2020-12-31: 06:00:00 10 mg via ORAL
  Filled 2020-12-31: qty 2

## 2020-12-31 MED ORDER — IPRATROPIUM-ALBUTEROL 0.5-2.5 (3) MG/3ML IN SOLN
3.0000 mL | Freq: Three times a day (TID) | RESPIRATORY_TRACT | Status: DC
  Administered 2020-12-31 – 2021-01-01 (×2): 3 mL via RESPIRATORY_TRACT
  Filled 2020-12-31 (×2): qty 3

## 2020-12-31 NOTE — Evaluation (Signed)
Physical Therapy Evaluation Patient Details Name: Sharon Reed MRN: [id number] DOB: 1927-08-27 Today's Date: 12/31/2020  History of Present Illness  87yo female who presented on 01/13/2021 with 4 day hx of cough, wheezing, SOB, fever. Hypoxic on RA. + for flu A. Admitted with sepsis and viral PNA due to influenza A. PMH COPD, HTN, dementia, hx R radial and tibial plateau fractures  Clinical Impression   Received in bed, pleasant and cooperative; daughter present and acted as interpreter throughout session. Able to generally mobilize on a Min guard-MinA basis with RW, however session was limited by elevated RR (30-46/min) and orthostatic BP in standing (80/61). SpO2 >94% on 4LPM. Remained tachypneic with return to supine, however BP did recover to 123/51, RN aware. Left on stretcher with all needs met, family present. Seems to have excellent support from family at home- for now, will plan for HHPT f/u at DC.        Recommendations for follow up therapy are one component of a multi-disciplinary discharge planning process, led by the attending physician.  Recommendations may be updated based on patient status, additional functional criteria and insurance authorization.  Follow Up Recommendations Home health PT    Assistance Recommended at Discharge Frequent or constant Supervision/Assistance  Functional Status Assessment Patient has had a recent decline in their functional status and demonstrates the ability to make significant improvements in function in a reasonable and predictable amount of time.  Equipment Recommendations  BSC/3in1;Other (comment) (otherwise well equipped)    Recommendations for Other Services       Precautions / Restrictions Precautions Precautions: Fall;Other (comment) Precaution Comments: watch RR/BP Restrictions Weight Bearing Restrictions: No      Mobility  Bed Mobility Overal bed mobility: Needs Assistance Bed Mobility: Supine to Sit;Sit to Supine     Supine  to sit: Min assist;HOB elevated Sit to supine: Min assist;HOB elevated   General bed mobility comments: MinA to boost trunk to upright sitting and gain balance, then MinA for return to bed mostly for BLE management    Transfers Overall transfer level: Needs assistance Equipment used: Rolling walker (2 wheels) Transfers: Sit to/from Stand Sit to Stand: Min guard;From elevated surface           General transfer comment: able to stand well from elevated ED stretcher (unable to get it appropriately low for her height), no unsteadiness or LOB    Ambulation/Gait               General Gait Details: deferred- hypotension in standing  Stairs            Wheelchair Mobility    Modified Rankin (Stroke Patients Only)       Balance Overall balance assessment: Needs assistance Sitting-balance support: Bilateral upper extremity supported;Feet unsupported Sitting balance-Leahy Scale: Good     Standing balance support: Bilateral upper extremity supported;Reliant on assistive device for balance;During functional activity Standing balance-Leahy Scale: Fair                               Pertinent Vitals/Pain Pain Assessment: No/denies pain    Home Living Family/patient expects to be discharged to:: Private residence Living Arrangements: Children Available Help at Discharge: Family;Available 24 hours/day Type of Home: House Home Access: Stairs to enter Entrance Stairs-Rails: None Entrance Stairs-Number of Steps: 2   Home Layout: One level Home Equipment: Conservation officer, nature (2 wheels);Shower seat;Transport chair;Cane - quad Additional Comments: using walker more; daughter reports MD cleared  her from elbow WBAT precaution and now using regular 2 wheeled walker, not platform    Prior Function Prior Level of Function : Needs assist  Cognitive Assist : Mobility (cognitive);ADLs (cognitive)     Physical Assist : Mobility (physical);ADLs (physical)     Mobility  Comments: family walks laps with her around the house 4-5 times per day, no falls or close calls with falling ADLs Comments: does need help bathing     Hand Dominance   Dominant Hand: Right    Extremity/Trunk Assessment   Upper Extremity Assessment Upper Extremity Assessment: Defer to OT evaluation    Lower Extremity Assessment Lower Extremity Assessment: Generalized weakness    Cervical / Trunk Assessment Cervical / Trunk Assessment: Kyphotic  Communication   Communication: Prefers language other than English (Spanish)  Cognition Arousal/Alertness: Awake/alert Behavior During Therapy: WFL for tasks assessed/performed Overall Cognitive Status: History of cognitive impairments - at baseline                                 General Comments: hx of dementia but followed cues/commands well with daughter interpreting        General Comments General comments (skin integrity, edema, etc.): SpO2 >94% on 4LPM with activity, RR 30-46/min, BP WNL supine but 80/61 in standing, asymptomatic    Exercises     Assessment/Plan    PT Assessment Patient needs continued PT services  PT Problem List Decreased strength;Decreased activity tolerance;Decreased balance;Decreased knowledge of precautions;Decreased mobility;Cardiopulmonary status limiting activity;Decreased coordination       PT Treatment Interventions DME instruction;Balance training;Gait training;Stair training;Functional mobility training;Patient/family education;Wheelchair mobility training;Therapeutic activities;Therapeutic exercise;Manual techniques    PT Goals (Current goals can be found in the Care Plan section)  Acute Rehab PT Goals Patient Stated Goal: go home when medically ready PT Goal Formulation: With family Time For Goal Achievement: 01/14/21 Potential to Achieve Goals: Good    Frequency Min 3X/week   Barriers to discharge        Co-evaluation               AM-PAC PT "6 Clicks"  Mobility  Outcome Measure Help needed turning from your back to your side while in a flat bed without using bedrails?: A Little Help needed moving from lying on your back to sitting on the side of a flat bed without using bedrails?: A Little Help needed moving to and from a bed to a chair (including a wheelchair)?: A Little Help needed standing up from a chair using your arms (e.g., wheelchair or bedside chair)?: A Little Help needed to walk in hospital room?: A Little Help needed climbing 3-5 steps with a railing? : A Lot 6 Click Score: 17    End of Session Equipment Utilized During Treatment: Gait belt;Oxygen Activity Tolerance: Patient tolerated treatment well;Treatment limited secondary to medical complications (Comment) (hypotension) Patient left: in bed;with call bell/phone within reach;with family/visitor present (ED stretcher) Nurse Communication: Mobility status;Other (comment) (hypotension) PT Visit Diagnosis: Muscle weakness (generalized) (M62.81);Difficulty in walking, not elsewhere classified (R26.2);Unsteadiness on feet (R26.81)    Time: 1030-1055 PT Time Calculation (min) (ACUTE ONLY): 25 min   Charges:   PT Evaluation $PT Eval Moderate Complexity: 1 Mod PT Treatments $Therapeutic Activity: 8-22 mins       Windell Norfolk, DPT, PN2   Supplemental Physical Therapist [place]    Pager [phone] Acute Rehab Office [phone]

## 2020-12-31 NOTE — ED Notes (Signed)
Pt had noted bm that was diarrhea. Pt cleaned and changed at this time. Pt warm to the touch. Oral temp taken within normal range. Rectal temp taken at this time

## 2020-12-31 NOTE — ED Notes (Signed)
Radiology at bedside

## 2020-12-31 NOTE — Progress Notes (Signed)
PROGRESS NOTE                                                                                                                                                                                                             Patient Demographics:    Sharon Reed, is a 87 y.o. female, DOB - Feb 11, 1927, WLS:[id number]  Outpatient Primary MD for the patient is Harlen Labs, NP    LOS - 1  Admit date - 01/16/21    Chief Complaint  Patient presents with   Shortness of Breath       Brief Narrative (HPI from H&P) - 87 y.o. female with medical history significant of COPD, HTN, dementia came with 4 days of cough, wheezing, SOB and fever, in the ER she was diagnosed with acute hypoxic respiratory failure due to influenza A pneumonia and admitted to the hospital.   Subjective:    Deniece Portela today has, No headache, No chest pain, No abdominal pain - No Nausea, No new weakness tingling or numbness, +ve cough and SOB.   Assessment  & Plan :      Acute Hypoxic Resp. Failure due to influenza and pneumonia- she has moderate inflammation as evidenced by her CRP and D-dimer levels, she is hypoxic requiring 4 L of oxygen, has been started on Tamiflu, trial of steroids to reduce inflammation.  Monitor closely.  Prognosis is poor.  Not a candidate for intubation due to her advanced age, dementia and frail status.  Encouraged the patient to sit up in chair in the daytime use I-S and flutter valve for pulmonary toiletry.  Will advance activity and titrate down oxygen as possible.  2.  Hyponatremia, hypomagnesemia.  Due to dehydration.  Magnesium replaced IV fluids and monitor.   3.  Elevated D-dimer.  Likely due to inflammation from #1 above, Lovenox at prophylactic dose for now, lower extremity venous duplex.  4.  Hypertension.  On Norvasc along with beta-blocker.  Monitor.  5.  Moderate protein calorie malnutrition.  Protein  supplementation.   SpO2: 98 % O2 Flow Rate (L/min): 4 L/min FiO2 (%): 36 %        Condition - Extremely Guarded  Family Communication  :  daughter bedside 12/31/20 - DNR, poor prognosis  Code Status :  DNR  Consults  :  None  PUD Prophylaxis : PPI  Procedures  :            Disposition Plan  :    Status is: Inpatient  Remains inpatient appropriate because: Flu Pneumonia    DVT Prophylaxis  :    enoxaparin (LOVENOX) injection 30 mg Start: 01/05/2021 1745   Lab Results  Component Value Date   PLT 212 01/07/2021    Diet :  Diet Order             DIET SOFT Room service appropriate? Yes; Fluid consistency: Thin  Diet effective now                    Inpatient Medications  Scheduled Meds:  amLODipine  10 mg Oral Daily   benzonatate  100 mg Oral BID   budesonide (PULMICORT) nebulizer solution  2 mg Nebulization Q12H   dexamethasone (DECADRON) injection  10 mg Intravenous Q24H   enoxaparin (LOVENOX) injection  30 mg Subcutaneous Q24H   guaiFENesin  1,200 mg Oral BID   ipratropium-albuterol  3 mL Nebulization Q6H   metoprolol tartrate  100 mg Oral BID   mometasone-formoterol  2 puff Inhalation BID   oseltamivir  30 mg Oral Q24H   pantoprazole  40 mg Oral Daily   Continuous Infusions:  lactated ringers 75 mL/hr at 12/31/20 0617   magnesium sulfate bolus IVPB     PRN Meds:.acetaminophen  Antibiotics  :    Anti-infectives (From admission, onward)    Start     Dose/Rate Route Frequency Ordered Stop   01/24/2021 1800  oseltamivir (TAMIFLU) capsule 30 mg        30 mg Oral Every 24 hours 01/26/2021 1734 01/04/21 1759        Time Spent in minutes  30   Susa Raring M.D on 12/31/2020 at 10:24 AM  To page go to [email or web address]   [facility] -  Office  [phone]  See all Orders from today for further details    Objective:   Vitals:   12/31/20 0744 12/31/20 0746 12/31/20 0830 12/31/20 0930  BP:   (!) 131/49 (!) 123/56  Pulse:   100  99  Resp:   17 (!) 28  Temp:      TempSrc:      SpO2: 100% 100% 98% 98%  Weight:      Height:        Wt Readings from Last 3 Encounters:  01/15/2021 45.4 kg  07/02/20 46.7 kg     Intake/Output Summary (Last 24 hours) at 12/31/2020 1024 Last data filed at 01/03/2021 2207 Gross per 24 hour  Intake 500 ml  Output --  Net 500 ml     Physical Exam  Awake but confused, No new F.N deficits,   [place].AT,PERRAL Supple Neck, No JVD,   Symmetrical Chest wall movement, Good air movement bilaterally, +ve rales RRR,No Gallops,Rubs or new Murmurs,  +ve B.Sounds, Abd Soft, No tenderness,   No Cyanosis, Clubbing or edema      Data Review:    CBC Recent Labs  Lab 01/24/2021 1538 01/17/2021 1811  WBC 11.3*  --   HGB 13.3 13.9  HCT 40.9 41.0  PLT 212  --   MCV 89.5  --   MCH 29.1  --   MCHC 32.5  --   RDW 13.5  --   LYMPHSABS 0.1*  --   MONOABS 0.1  --   EOSABS 0.0  --   BASOSABS 0.0  --     Electrolytes Recent Labs  Lab 01/16/2021 1538 01/03/2021 1750 01/25/2021 1811 12/31/20 0318 12/31/20 0603 12/31/20 0626  NA 128*  --  127* 129*  --   --   K 4.6  --  5.1 4.9  --   --   CL 97*  --   --  96*  --   --   CO2 21*  --   --  24  --   --   GLUCOSE 117*  --   --  102*  --   --   BUN 21  --   --  22  --   --   CREATININE 1.04*  --   --  0.92  --   --   CALCIUM 8.8*  --   --  8.6*  --   --   MG  --   --   --   --   --  1.6*  CRP  --   --   --   --   --  17.5*  DDIMER  --   --   --   --   --  3.93*  LATICACIDVEN  --  2.2*  --  2.3*  --   --   BNP  --   --   --   --  371.5*  --     ------------------------------------------------------------------------------------------------------------------ No results for input(s): CHOL, HDL, LDLCALC, TRIG, CHOLHDL, LDLDIRECT in the last 72 hours.  No results found for: HGBA1C  No results for input(s): TSH, T4TOTAL, T3FREE, THYROIDAB in the last 72 hours.  Invalid input(s):  FREET3 ------------------------------------------------------------------------------------------------------------------ ID Labs Recent Labs  Lab 01/14/2021 1538 01/04/2021 1750 12/31/20 0318 12/31/20 0626  WBC 11.3*  --   --   --   PLT 212  --   --   --   CRP  --   --   --  17.5*  DDIMER  --   --   --  3.93*  LATICACIDVEN  --  2.2* 2.3*  --   CREATININE 1.04*  --  0.92  --    Cardiac Enzymes No results for input(s): CKMB, TROPONINI, MYOGLOBIN in the last 168 hours.  Invalid input(s): CK   Radiology Reports DG Chest Port 1 View  Result Date: 12/31/2020 CLINICAL DATA:  87 year old female with fever and shortness of breath. EXAM: PORTABLE CHEST 1 VIEW COMPARISON:  01/13/2021 portable chest and earlier. FINDINGS: Portable AP semi upright view at 0621 hours. Mildly improved lung volumes. Mild tortuosity of the thoracic aorta. Other mediastinal contours are within normal limits. No pneumothorax or pulmonary edema. Regressed but not resolved mild patchy opacity at the left lung base, although also similar to September and April chest radiographs this year. And there is probably a small chronic left pleural effusion. No areas of worsening ventilation. No acute osseous abnormality identified. IMPRESSION: Probable small chronic left pleural effusion with mild atelectasis or scarring at the left lung base. No definite acute cardiopulmonary abnormality. Electronically Signed   By: Odessa Fleming M.D.   On: 12/31/2020 06:38   DG Chest Portable 1 View  Result Date: 01/27/2021 CLINICAL DATA:  Cough, flu positive. EXAM: PORTABLE CHEST 1 VIEW COMPARISON:  October 09, 2020 FINDINGS: Cardiomediastinal silhouette is normal. Mediastinal contours appear intact. Mild peribronchial airspace consolidation the left lower lobe. Osseous structures are without acute abnormality. Soft tissues are grossly normal. IMPRESSION: Mild peribronchial airspace consolidation the left lower lobe may represent early atypical pneumonia.  Electronically Signed   By: Ted Mcalpine M.D.   On:  01/10/2021 16:23      

## 2020-12-31 NOTE — ED Notes (Signed)
Provider made aware of pt repeat rectal temp

## 2020-12-31 NOTE — ED Notes (Signed)
Pt able to stand and pivot with assistance to bedside commode.

## 2020-12-31 NOTE — ED Notes (Signed)
Pt cleaned up. Pt had an episode of diarrhea. Diaper and chuck changed.

## 2020-12-31 NOTE — Plan of Care (Signed)
Initiate Care Plan   Problem: Education: Goal: Knowledge of General Education information will improve Description: Including pain rating scale, medication(s)/side effects and non-pharmacologic comfort measures Outcome: Progressing   Problem: Health Behavior/Discharge Planning: Goal: Ability to manage health-related needs will improve Outcome: Progressing   Problem: Clinical Measurements: Goal: Ability to maintain clinical measurements within normal limits will improve Outcome: Progressing Goal: Will remain free from infection Outcome: Progressing Goal: Diagnostic test results will improve Outcome: Progressing Goal: Respiratory complications will improve Outcome: Progressing Goal: Cardiovascular complication will be avoided Outcome: Progressing   Problem: Activity: Goal: Risk for activity intolerance will decrease Outcome: Progressing   Problem: Nutrition: Goal: Adequate nutrition will be maintained Outcome: Progressing   Problem: Coping: Goal: Level of anxiety will decrease Outcome: Progressing   Problem: Elimination: Goal: Will not experience complications related to bowel motility Outcome: Progressing Goal: Will not experience complications related to urinary retention Outcome: Progressing   Problem: Pain Managment: Goal: General experience of comfort will improve Outcome: Progressing   Problem: Safety: Goal: Ability to remain free from injury will improve Outcome: Progressing   Problem: Skin Integrity: Goal: Risk for impaired skin integrity will decrease Outcome: Progressing   Problem: Education: Goal: Knowledge of disease or condition will improve Outcome: Progressing Goal: Knowledge of the prescribed therapeutic regimen will improve Outcome: Progressing Goal: Individualized Educational Video(s) Outcome: Progressing   Problem: Respiratory: Goal: Ability to maintain a clear airway will improve Outcome: Progressing Goal: Levels of oxygenation will  improve Outcome: Progressing Goal: Ability to maintain adequate ventilation will improve Outcome: Progressing   Problem: Activity: Goal: Ability to tolerate increased activity will improve Outcome: Progressing Goal: Will verbalize the importance of balancing activity with adequate rest periods Outcome: Progressing

## 2020-12-31 NOTE — ED Notes (Signed)
Dr. Thedore Mins at bedside. Dr.Singh explained DNR to patient. Patient was understanding. Dr. Thedore Mins stated we will put DNR orders in place. Family aware.

## 2020-12-31 NOTE — ED Notes (Signed)
Provider made aware of current rectal temp

## 2020-12-31 NOTE — ED Notes (Signed)
PT at bedside.

## 2021-01-01 ENCOUNTER — Inpatient Hospital Stay (HOSPITAL_COMMUNITY): Payer: Medicaid Other

## 2021-01-01 ENCOUNTER — Encounter (HOSPITAL_COMMUNITY): Payer: Medicaid Other

## 2021-01-01 DIAGNOSIS — R0602 Shortness of breath: Secondary | ICD-10-CM

## 2021-01-01 DIAGNOSIS — R0902 Hypoxemia: Secondary | ICD-10-CM | POA: Diagnosis not present

## 2021-01-01 DIAGNOSIS — Z66 Do not resuscitate: Secondary | ICD-10-CM

## 2021-01-01 DIAGNOSIS — J101 Influenza due to other identified influenza virus with other respiratory manifestations: Secondary | ICD-10-CM

## 2021-01-01 DIAGNOSIS — Z515 Encounter for palliative care: Secondary | ICD-10-CM

## 2021-01-01 DIAGNOSIS — F0390 Unspecified dementia without behavioral disturbance: Secondary | ICD-10-CM

## 2021-01-01 LAB — CBC WITH DIFFERENTIAL/PLATELET
Abs Immature Granulocytes: 1 10*3/uL — ABNORMAL HIGH (ref 0.00–0.07)
Basophils Absolute: 0 10*3/uL (ref 0.0–0.1)
Basophils Relative: 0 %
Eosinophils Absolute: 0 10*3/uL (ref 0.0–0.5)
Eosinophils Relative: 0 %
HCT: 36.8 % (ref 36.0–46.0)
Hemoglobin: 12.2 g/dL (ref 12.0–15.0)
Lymphocytes Relative: 13 %
Lymphs Abs: 0.6 10*3/uL — ABNORMAL LOW (ref 0.7–4.0)
MCH: 28.4 pg (ref 26.0–34.0)
MCHC: 33.2 g/dL (ref 30.0–36.0)
MCV: 85.8 fL (ref 80.0–100.0)
Metamyelocytes Relative: 22 %
Monocytes Absolute: 0.5 10*3/uL (ref 0.1–1.0)
Monocytes Relative: 11 %
Neutro Abs: 2.5 10*3/uL (ref 1.7–7.7)
Neutrophils Relative %: 54 %
Platelets: 202 10*3/uL (ref 150–400)
RBC: 4.29 MIL/uL (ref 3.87–5.11)
RDW: 13.4 % (ref 11.5–15.5)
WBC: 4.6 10*3/uL (ref 4.0–10.5)
nRBC: 0 % (ref 0.0–0.2)
nRBC: 0 /100 WBC

## 2021-01-01 LAB — BLOOD GAS, VENOUS
Acid-Base Excess: 0.4 mmol/L (ref 0.0–2.0)
Bicarbonate: 27.1 mmol/L (ref 20.0–28.0)
FIO2: 48
O2 Saturation: 40.3 %
Patient temperature: 36.6
pCO2, Ven: 65.4 mmHg — ABNORMAL HIGH (ref 44.0–60.0)
pH, Ven: 7.238 — ABNORMAL LOW (ref 7.250–7.430)
pO2, Ven: <31 mmHg — CL (ref 32.0–45.0)

## 2021-01-01 LAB — COMPREHENSIVE METABOLIC PANEL
ALT: 21 U/L (ref 0–44)
AST: 45 U/L — ABNORMAL HIGH (ref 15–41)
Albumin: 3 g/dL — ABNORMAL LOW (ref 3.5–5.0)
Alkaline Phosphatase: 64 U/L (ref 38–126)
Anion gap: 9 (ref 5–15)
BUN: 26 mg/dL — ABNORMAL HIGH (ref 8–23)
CO2: 22 mmol/L (ref 22–32)
Calcium: 8.8 mg/dL — ABNORMAL LOW (ref 8.9–10.3)
Chloride: 93 mmol/L — ABNORMAL LOW (ref 98–111)
Creatinine, Ser: 0.83 mg/dL (ref 0.44–1.00)
GFR, Estimated: >60 mL/min (ref >60)
Glucose, Bld: 142 mg/dL — ABNORMAL HIGH (ref 70–99)
Potassium: 4.6 mmol/L (ref 3.5–5.1)
Sodium: 124 mmol/L — ABNORMAL LOW (ref 135–145)
Total Bilirubin: 0.4 mg/dL (ref 0.3–1.2)
Total Protein: 6.2 g/dL — ABNORMAL LOW (ref 6.5–8.1)

## 2021-01-01 LAB — C-REACTIVE PROTEIN: CRP: 21.6 mg/dL — ABNORMAL HIGH (ref <1.0)

## 2021-01-01 LAB — PROCALCITONIN: Procalcitonin: 6.11 ng/mL

## 2021-01-01 LAB — MAGNESIUM: Magnesium: 2.2 mg/dL (ref 1.7–2.4)

## 2021-01-01 LAB — BRAIN NATRIURETIC PEPTIDE: B Natriuretic Peptide: 342.4 pg/mL — ABNORMAL HIGH (ref 0.0–100.0)

## 2021-01-01 LAB — UREA NITROGEN, URINE: Urea Nitrogen, Ur: 884 mg/dL

## 2021-01-01 LAB — D-DIMER, QUANTITATIVE: D-Dimer, Quant: 4.83 ug/mL-FEU — ABNORMAL HIGH (ref 0.00–0.50)

## 2021-01-01 MED ORDER — IPRATROPIUM-ALBUTEROL 0.5-2.5 (3) MG/3ML IN SOLN
3.0000 mL | Freq: Once | RESPIRATORY_TRACT | Status: AC
  Administered 2021-01-01: 3 mL via RESPIRATORY_TRACT
  Filled 2021-01-01: qty 3

## 2021-01-01 MED ORDER — ACETAMINOPHEN 650 MG RE SUPP
650.0000 mg | RECTAL | Status: DC | PRN
  Administered 2021-01-01: 650 mg via RECTAL
  Filled 2021-01-01 (×2): qty 1

## 2021-01-01 MED ORDER — GLYCOPYRROLATE 0.2 MG/ML IJ SOLN
0.4000 mg | Freq: Four times a day (QID) | INTRAMUSCULAR | Status: DC
  Administered 2021-01-01 (×4): 0.4 mg via INTRAVENOUS
  Filled 2021-01-01 (×5): qty 2

## 2021-01-01 MED ORDER — MORPHINE SULFATE (PF) 2 MG/ML IV SOLN
1.0000 mg | INTRAVENOUS | Status: DC | PRN
  Administered 2021-01-01 (×4): 1 mg via INTRAVENOUS
  Filled 2021-01-01 (×4): qty 1

## 2021-01-01 MED ORDER — MORPHINE BOLUS VIA INFUSION
1.0000 mg | INTRAVENOUS | Status: DC | PRN
  Administered 2021-01-01 – 2021-01-02 (×3): 1 mg via INTRAVENOUS
  Filled 2021-01-01: qty 1

## 2021-01-01 MED ORDER — ACETAMINOPHEN 650 MG RE SUPP
650.0000 mg | Freq: Four times a day (QID) | RECTAL | Status: DC | PRN
  Administered 2021-01-01: 650 mg via RECTAL
  Filled 2021-01-01: qty 1

## 2021-01-01 MED ORDER — MORPHINE 100MG IN NS 100ML (1MG/ML) PREMIX INFUSION
2.0000 mg/h | INTRAVENOUS | Status: DC
  Administered 2021-01-01: 2 mg/h via INTRAVENOUS
  Filled 2021-01-01: qty 100

## 2021-01-01 MED ORDER — FUROSEMIDE 10 MG/ML IJ SOLN
40.0000 mg | Freq: Once | INTRAMUSCULAR | Status: AC
  Administered 2021-01-01: 40 mg via INTRAVENOUS
  Filled 2021-01-01: qty 4

## 2021-01-01 MED ORDER — IBUPROFEN 200 MG PO TABS: 400.0000 mg | ORAL_TABLET | Freq: Four times a day (QID) | ORAL | Status: DC | PRN

## 2021-01-01 MED ORDER — PANTOPRAZOLE SODIUM 40 MG IV SOLR: 40.0000 mg | Freq: Every day | INTRAVENOUS | Status: DC

## 2021-01-01 MED ORDER — LORAZEPAM 2 MG/ML IJ SOLN
1.0000 mg | INTRAMUSCULAR | Status: DC | PRN
  Administered 2021-01-01: 1 mg via INTRAVENOUS
  Filled 2021-01-01: qty 1

## 2021-01-01 NOTE — Progress Notes (Signed)
   01/01/21 0000 01/01/21 0645  Assess: MEWS Score  BP 127/68  --   Pulse Rate 85 (!) 104  ECG Heart Rate 93 (!) 104  Resp (!) 32 (!) 38  SpO2 98 %  --   O2 Device Nasal Cannula  --   Patient Activity (if Appropriate) In bed  --   O2 Flow Rate (L/min) 4 L/min  --   Assess: MEWS Score  MEWS Temp 1 0  MEWS Systolic 0 0  MEWS Pulse 0 1  MEWS RR 2 3  MEWS LOC 0 0  MEWS Score 3 4  MEWS Score Color Yellow Red  Assess: if the MEWS score is Yellow or Red  Were vital signs taken at a resting state? Yes  --   Focused Assessment Change from prior assessment (see assessment flowsheet)  --   Early Detection of Sepsis Score *See Row Information* High  --   MEWS guidelines implemented *See Row Information* Yes  --   Treat  Pain Scale 0-10  --   Pain Score 0  --   Take Vital Signs  Increase Vital Sign Frequency  Yellow: Q 2hr X 2 then Q 4hr X 2, if remains yellow, continue Q 4hrs  --   Pt New Yellow Mews - to notify MD

## 2021-01-01 NOTE — Progress Notes (Signed)
Placed on BIPAP per MD order per ABG results.

## 2021-01-01 NOTE — Progress Notes (Signed)
   01/01/21 0640  Escalate  MEWS: Escalate Red: discuss with charge nurse/RN and provider, consider discussing with RRT  Notify: Charge Nurse/RN  Name of Charge Nurse/RN Notified Idelia Salm  Date Charge Nurse/RN Notified 01/01/21  Time Charge Nurse/RN Notified 0641  Pt red Mews - Red Mews activated

## 2021-01-01 NOTE — Progress Notes (Signed)
   01/01/21 0645  Assess: MEWS Score  Pulse Rate (!) 104  ECG Heart Rate (!) 104  Resp (!) 38  SpO2 97 %  Assess: MEWS Score  MEWS Temp 0  MEWS Systolic 0  MEWS Pulse 1  MEWS RR 3  MEWS LOC 0  MEWS Score 4  MEWS Score Color Red  Notify: Rapid Response  Name of Rapid Response RN Notified Onalee Hua RN (Made aware of Red Mews and actions taken this night)  Date Rapid Response Notified 01/01/21  Time Rapid Response Notified 0645

## 2021-01-01 NOTE — Progress Notes (Signed)
   01/01/21 1413  Clinical Encounter Type  Visited With Patient not available  Visit Type Initial;Spiritual support  Referral From Nurse  Consult/Referral To Chaplain   Chaplain Tery Sanfilippo received call from the attending nurse, Jamesetta Orleans. She said the patient's family is requesting a Catholic priest. Donnajean Lopes called [facility] and spoke with Father Minerva Areola. Father Minerva Areola requested that the family call him. Donnajean Lopes provided information to [place]. Advise Chaplain remains available for  follow up spiritual and emotional support as needed. This note was prepared by Deneen Harts, M.Div..  For questions please contact by phone [phone].

## 2021-01-01 NOTE — Progress Notes (Signed)
   01/01/21 0617  Notify: Charge Nurse/RN  Name of Charge Nurse/RN Notified Idelia Salm (Pt to be placed on Bi-Pap due to venus blood gas)  Date Charge Nurse/RN Notified 01/01/21  Time Charge Nurse/RN Notified 7048  Notify: Provider  Provider Name/Title Dr. Rachael Darby  Date Provider Notified 01/01/21  Time Provider Notified [phone]  Notification Type Page  Notification Reason Critical result  Provider response See new orders  Date of Provider Response 01/01/21  Time of Provider Response [phone]  Document  Patient Outcome Not stable and remains on department

## 2021-01-01 NOTE — Consult Note (Signed)
Consultation Note Date: 01/01/2021   Patient Name: Sharon Reed  DOB: 19-Aug-1927  MRN: [id number]  Age / Sex: 87 y.o., female  PCP: Harlen Labs, NP Referring Physician: Leroy Sea, MD  Reason for Consultation: Establishing goals of care and Psychosocial/spiritual support  HPI/Patient Profile: 87 y.o. female   admitted on 01/16/2021 with  medical history significant of COPD, HTN, dementia came with 4 days of cough, wheezing, SOB and fever, in the ER she was diagnosed with acute hypoxic respiratory failure due to influenza A pneumonia and admitted to the hospital.    Family face treatment option decisions, advanced directive decisions and anticipatory care needs.   Clinical Assessment and Goals of Care:    This NP Lorinda Creed reviewed medical records, received report from team, assessed the patient and then meet at the patient's bedside along with her daughter and Spanish tele-interpreter   to discuss diagnosis, prognosis, GOC, EOL wishes disposition and options.   Concept of Palliative Care was introduced as specialized medical care for people and their families living with serious illness.  If focuses on providing relief from the symptoms and stress of a serious illness.  The goal is to improve quality of life for both the patient and the family.  Values and goals of care important to patient and family were attempted to be elicited.  Created space and opportunity for daughter to explore thoughts and feelings regarding current medical situation.  She is tearful as she verbalizes an understanding of the impending loss of her mother.  She expresses love and appreciation for her mom, she hopes for comfort for her at this time/EOL. Emotional support offered.   Education offered on the difference between a aggressive medical intervention path  and a palliative comfort care path for this patient at this  time was had.     Education offered on the natural trajectory and expectations of end-of-life.         Questions and concerns addressed.  Family  encouraged to call with questions or concerns.     PMT will continue to support holistically.              NEXT OF KIN/ [place]    SUMMARY OF RECOMMENDATIONS    Code Status/Advance Care Planning: DNR No BiPap   Symptom Management:  Dyspnea/Pain: Morphine 1 mg every 1 hr prn  Agitation: Ativan 1 mg IV every 4 hrs prn Terminal secretions: Robinul 0.4 mg Foley cath for EOL care   Palliative Prophylaxis:  Eye Care, Frequent Pain Assessment, and Oral Care  Additional Recommendations (Limitations, Scope, Preferences): Full Comfort Care No artifical feeding or hydration now or in the future No furtehr diagnostics, no life prolong measures--allow a natural death   Psycho-social/Spiritual:  Desire for further Chaplaincy support:yes Additional Recommendations: Grief/Bereavement Support  Prognosis:  Hours - Days  Discharge Planning: Anticipated Hospital Death      Primary Diagnoses: Present on Admission:  Hypoxia   I have reviewed the medical record, interviewed the patient and family, and examined the  patient. The following aspects are pertinent.  Past Medical History:  Diagnosis Date   Dementia (HCC)    Hypertension    Social History   Socioeconomic History   Marital status: Single    Spouse name: Not on file   Number of children: Not on file   Years of education: Not on file   Highest education level: Not on file  Occupational History   Not on file  Tobacco Use   Smoking status: Never   Smokeless tobacco: Never  Substance and Sexual Activity   Alcohol use: Not Currently   Drug use: Not Currently   Sexual activity: Not on file  Other Topics Concern   Not on file  Social History Narrative   Not on file   Social Determinants of Health   Financial Resource Strain: Not on file  Food Insecurity:  Not on file  Transportation Needs: Not on file  Physical Activity: Not on file  Stress: Not on file  Social Connections: Not on file   History reviewed. No pertinent family history. Scheduled Meds:  glycopyrrolate  0.4 mg Intravenous QID   ipratropium-albuterol  3 mL Nebulization TID   mometasone-formoterol  2 puff Inhalation BID   Continuous Infusions: PRN Meds:.acetaminophen, acetaminophen, LORazepam, morphine injection, phenol Medications Prior to Admission:  Prior to Admission medications   Medication Sig Start Date End Date Taking? Authorizing Provider  Budeson-Glycopyrrol-Formoterol (BREZTRI AEROSPHERE) 160-9-4.8 MCG/ACT AERO Inhale 1 puff into the lungs daily.   Yes [provider]  ipratropium-albuterol (DUONEB) 0.5-2.5 (3) MG/3ML SOLN Take 3 mLs by nebulization every 4 (four) hours as needed (For shortness of breath).   Yes [provider]  losartan (COZAAR) 50 MG tablet Take 50 mg by mouth daily.   Yes [provider]  metoprolol tartrate (LOPRESSOR) 100 MG tablet Take 100 mg by mouth 2 (two) times daily.   Yes [provider]  mometasone-formoterol (DULERA) 200-5 MCG/ACT AERO Inhale 2 puffs into the lungs 2 (two) times daily.   Yes [provider]  benzonatate (TESSALON) 100 MG capsule Take 1 capsule (100 mg total) by mouth 3 (three) times daily as needed for cough. Patient not taking: Reported on 10/09/2020 07/02/20   Tilden Fossa, MD  cholecalciferol (VITAMIN D) 25 MCG tablet Take 1 tablet (1,000 Units total) by mouth daily. Patient not taking: Reported on 12/31/2020 10/13/20 01/11/21  Despina Hidden, PA-C  metoCLOPramide (REGLAN) 5 MG tablet Take 1-2 tablets (5-10 mg total) by mouth 3 (three) times daily before meals for 20 days. Patient not taking: Reported on 12/31/2020 10/14/20 11/03/20  Rhetta Mura, MD  oxyCODONE (OXY IR/ROXICODONE) 5 MG immediate release tablet Take 1 tablet (5 mg total) by mouth every 4 (four) hours as  needed for severe pain. Patient not taking: Reported on 12/31/2020 10/13/20   Despina Hidden, PA-C   Allergies  Allergen Reactions   Penicillins Other (See Comments)    Weakness, fainting   Review of Systems  Unable to perform ROS: Acuity of condition     Currently in BiPap, agitated and generally uncomfortable   Physical Exam Constitutional:      Appearance: She is underweight. She is ill-appearing.  Cardiovascular:     Rate and Rhythm: Tachycardia present.  Skin:    General: Skin is warm and dry.  Neurological:     Mental Status: She is confused.    Vital Signs: BP 92/60   Pulse (!) 162   Temp (!) 100.8 F (38.2 C) (Axillary)  Resp (!) 39   Ht 5' (1.524 m)   Wt 47.8 kg   SpO2 99%   BMI 20.58 kg/m  Pain Scale: 0-10   Pain Score: 0-No pain   SpO2: SpO2: 99 % O2 Device:SpO2: 99 % O2 Flow Rate: .O2 Flow Rate (L/min): 7 L/min  IO: Intake/output summary:  Intake/Output Summary (Last 24 hours) at 01/01/2021 0943 Last data filed at 01/01/2021 0400 Gross per 24 hour  Intake 538.75 ml  Output 450 ml  Net 88.75 ml    LBM: Last BM Date: 01-16-21 Baseline Weight: Weight: 45.4 kg Most recent weight: Weight: 47.8 kg     Palliative Assessment/Data: 20 %    Discussed with Dr Thedore Mins and bedside RN  Time In: 0845 Time Out: 1000 Time Total: 75 minutes Greater than 50%  of this time was spent counseling and coordinating care related to the above assessment and plan.  Signed by: Lorinda Creed, NP   Please contact Palliative Medicine Team phone at [phone] for questions and concerns.  For individual provider: See Loretha Stapler

## 2021-01-01 NOTE — Progress Notes (Signed)
OT Cancellation Note  Patient Details Name: Mikaya Bunner MRN: [id number] DOB: 1927-07-11   Cancelled Treatment:    Reason Eval/Treat Not Completed: Medical issues which prohibited therapy. Pt on Bipap as of this morning due to venus blood gas and increased respirations. Will continue to follow for appropriateness of OT eval.  Ignacia Palma, OTR/L Acute Rehab Services Pager [phone] Office [phone]    Evette Georges 01/01/2021, 8:18 AM

## 2021-01-01 NOTE — Plan of Care (Signed)
  Problem: Education: Goal: Knowledge of General Education information will improve Description: Including pain rating scale, medication(s)/side effects and non-pharmacologic comfort measures Outcome: Not Progressing   Problem: Health Behavior/Discharge Planning: Goal: Ability to manage health-related needs will improve Outcome: Not Progressing   Problem: Clinical Measurements: Goal: Ability to maintain clinical measurements within normal limits will improve Outcome: Not Progressing Goal: Will remain free from infection Outcome: Not Progressing Goal: Diagnostic test results will improve Outcome: Not Progressing Goal: Respiratory complications will improve Outcome: Not Progressing Goal: Cardiovascular complication will be avoided Outcome: Not Progressing   Problem: Activity: Goal: Risk for activity intolerance will decrease Outcome: Not Progressing   Problem: Nutrition: Goal: Adequate nutrition will be maintained Outcome: Not Progressing   Problem: Coping: Goal: Level of anxiety will decrease Outcome: Not Progressing   Problem: Elimination: Goal: Will not experience complications related to bowel motility Outcome: Not Progressing Goal: Will not experience complications related to urinary retention Outcome: Not Progressing   Problem: Pain Managment: Goal: General experience of comfort will improve Outcome: Not Progressing   Problem: Safety: Goal: Ability to remain free from injury will improve Outcome: Not Progressing   Problem: Skin Integrity: Goal: Risk for impaired skin integrity will decrease Outcome: Not Progressing   Problem: Education: Goal: Knowledge of disease or condition will improve Outcome: Not Progressing Goal: Knowledge of the prescribed therapeutic regimen will improve Outcome: Not Progressing Goal: Individualized Educational Video(s) Outcome: Not Progressing   Problem: Activity: Goal: Ability to tolerate increased activity will  improve Outcome: Not Progressing Goal: Will verbalize the importance of balancing activity with adequate rest periods Outcome: Not Progressing   Problem: Respiratory: Goal: Ability to maintain a clear airway will improve Outcome: Not Progressing Goal: Levels of oxygenation will improve Outcome: Not Progressing Goal: Ability to maintain adequate ventilation will improve Outcome: Not Progressing   Problem: Education: Goal: Knowledge of the prescribed therapeutic regimen will improve Outcome: Not Progressing   Problem: Coping: Goal: Ability to identify and develop effective coping behavior will improve Outcome: Not Progressing   Problem: Clinical Measurements: Goal: Quality of life will improve Outcome: Not Progressing   Problem: Respiratory: Goal: Verbalizations of increased ease of respirations will increase Outcome: Not Progressing   Problem: Role Relationship: Goal: Family's ability to cope with current situation will improve Outcome: Not Progressing Goal: Ability to verbalize concerns, feelings, and thoughts to partner or family member will improve Outcome: Not Progressing   Problem: Pain Management: Goal: Satisfaction with pain management regimen will improve Outcome: Not Progressing

## 2021-01-01 NOTE — Progress Notes (Signed)
   01/01/21 0340  Assess: MEWS Score  SpO2 90 %  O2 Device HFNC  Patient Activity (if Appropriate) In bed  O2 Flow Rate (L/min) 7 L/min  Notify: Provider  Provider Name/Title Dr. Rachael Darby  Date Provider Notified 04/26/20  Time Provider Notified 0330  Notification Type Face-to-face  Notification Reason Other (Comment) (Continues with increase respirtations and effort)  Provider response Other (Comment) (advised to place patient on HFNC at 7)  Date of Provider Response 01/01/21  Time of Provider Response 0330

## 2021-01-01 NOTE — Progress Notes (Signed)
   01/01/21 0015  Take Vital Signs  Increase Vital Sign Frequency  Yellow: Q 2hr X 2 then Q 4hr X 2, if remains yellow, continue Q 4hrs  Escalate  MEWS: Escalate Yellow: discuss with charge nurse/RN and consider discussing with provider and RRT  Notify: Charge Nurse/RN  Name of Charge Nurse/RN Notified Idelia Salm  Date Charge Nurse/RN Notified 01/01/21  Time Charge Nurse/RN Notified 0015  Notify: Provider  Provider Name/Title Dr. Rachael Darby  Date Provider Notified 01/01/21  Time Provider Notified 0015  Notification Type Page  Notification Reason Change in status (Yellow [place] notification)

## 2021-01-01 NOTE — Progress Notes (Signed)
Spoke to Dr. Rachael Darby, Pt respirations still in the 30 to high 30's, and pt still had rhonchi and rales, after intervention.  MD advised to place the Pt on high flow [place] around 7 L to help with O2 saturations, to decrease the Pt's respirations.   Pt placed in HFNC and explained to daughter why Pt placed on HFNC and prevention of Pt tiring out and prevention of Heated high flow and possible intubation.

## 2021-01-01 NOTE — Progress Notes (Signed)
PROGRESS NOTE                                                                                                                                                                                                             Patient Demographics:    Sharon Reed, is a 87 y.o. female, DOB - 25-Oct-1927, WNU:[id number]  Outpatient Primary MD for the patient is Harlen Labs, NP    LOS - 2  Admit date - Jan 10, 2021    Chief Complaint  Patient presents with   Shortness of Breath       Brief Narrative (HPI from H&P) - 87 y.o. female with medical history significant of COPD, HTN, dementia came with 4 days of cough, wheezing, SOB and fever, in the ER she was diagnosed with acute hypoxic respiratory failure due to influenza A pneumonia and admitted to the hospital.   Subjective:   Patient in bed wearing BiPAP in moderate respiratory distress, daughter helping with the interview, she denies any chest pain but does have shortness of breath.  No abdominal pain.   Assessment  & Plan :      Acute Hypoxic Resp. Failure due to influenza pneumonia with ARDS- she had moderate inflammation upon admission however it has gradually progressed even more despite Tamiflu and steroids, is DNR and prognosis remains grim, currently on BiPAP, continue supportive care, most likely will not survive this event, will involve palliative care as well.  Encouraged the patient to sit up in chair in the daytime use I-S and flutter valve for pulmonary toiletry.  Will advance activity and titrate down oxygen as possible.  2.  Hyponatremia, hypomagnesemia.  Due to dehydration, has been hydrated with IV fluids, magnesium has been replaced.  Due to severe and progressive hypoxic respiratory failure with holding further fluids and giving trial of Lasix as she is rapidly worsening her respiratory status.  3.  Elevated D-dimer.  Likely due to inflammation from #1 above,  Lovenox at prophylactic dose for now, lower extremity venous duplex.  4.  Hypertension.  On Norvasc along with beta-blocker.  Monitor.  5.  Moderate protein calorie malnutrition.  Protein supplementation.        Condition - Extremely Guarded  Family Communication  :  daughter bedside 12/31/20 - DNR, poor prognosis, discussed with daughter and another family member over the phone in  detail on 01/01/2021, medical treatment if significant decline then goal be to keep her comfortable.  Code Status :  DNR  Consults  : Palliative care  PUD Prophylaxis : PPI   Procedures  :            Disposition Plan  :    Status is: Inpatient  Remains inpatient appropriate because: Flu Pneumonia    DVT Prophylaxis  :    enoxaparin (LOVENOX) injection 30 mg Start: 01/17/2021 1745   Lab Results  Component Value Date   PLT 202 01/01/2021    Diet :  Diet Order             DIET SOFT Room service appropriate? Yes; Fluid consistency: Thin  Diet effective now                    Inpatient Medications  Scheduled Meds:  amLODipine  10 mg Oral Daily   benzonatate  100 mg Oral BID   dexamethasone (DECADRON) injection  10 mg Intravenous Q24H   enoxaparin (LOVENOX) injection  30 mg Subcutaneous Q24H   furosemide  40 mg Intravenous Once   guaiFENesin  1,200 mg Oral BID   ipratropium-albuterol  3 mL Nebulization TID   metoprolol tartrate  100 mg Oral BID   mometasone-formoterol  2 puff Inhalation BID   oseltamivir  30 mg Oral Q24H   pantoprazole (PROTONIX) IV  40 mg Intravenous QHS   Continuous Infusions:   PRN Meds:.acetaminophen, acetaminophen, phenol  Antibiotics  :    Anti-infectives (From admission, onward)    Start     Dose/Rate Route Frequency Ordered Stop   12/29/2020 1800  oseltamivir (TAMIFLU) capsule 30 mg        30 mg Oral Every 24 hours 01/01/2021 1734 01/04/21 1759        Time Spent in minutes  30   Susa Raring M.D on 01/01/2021 at 8:45 AM  To page go to  [email or web address]   [facility] -  Office  [phone]  See all Orders from today for further details    Objective:   Vitals:   01/01/21 0645 01/01/21 0700 01/01/21 0800 01/01/21 0838  BP:  97/69 (!) 119/105   Pulse: (!) 104 100 (!) 109 (!) 119  Resp: (!) 38 (!) 36 (!) 36 (!) 28  Temp:  97.7 F (36.5 C) 99.5 F (37.5 C)   TempSrc:  Axillary Axillary   SpO2: 97% (!) 83% 95% 99%  Weight:      Height:        Wt Readings from Last 3 Encounters:  12/31/20 47.8 kg  07/02/20 46.7 kg     Intake/Output Summary (Last 24 hours) at 01/01/2021 0845 Last data filed at 01/01/2021 0400 Gross per 24 hour  Intake 538.75 ml  Output 450 ml  Net 88.75 ml     Physical Exam  Awake but confused, no focal deficits moving all 4 extremities by herself, in moderate respiratory distress wearing BiPAP [place].AT,PERRAL Supple Neck, No JVD,   Symmetrical Chest wall movement, bilateral rales RRR,No Gallops, Rubs or new Murmurs,  +ve B.Sounds, Abd Soft, No tenderness,   No Cyanosis, Clubbing or edema       Data Review:    CBC Recent Labs  Lab 01/18/2021 1538 12/29/2020 1811 01/01/21 0016  WBC 11.3*  --  4.6  HGB 13.3 13.9 12.2  HCT 40.9 41.0 36.8  PLT 212  --  202  MCV 89.5  --  85.8  MCH  29.1  --  28.4  MCHC 32.5  --  33.2  RDW 13.5  --  13.4  LYMPHSABS 0.1*  --  0.6*  MONOABS 0.1  --  0.5  EOSABS 0.0  --  0.0  BASOSABS 0.0  --  0.0    Electrolytes Recent Labs  Lab 01-08-2021 1538 08-Jan-2021 1750 01/08/2021 1811 12/31/20 0318 12/31/20 0603 12/31/20 0626 01/01/21 0016 01/01/21 0550  NA 128*  --  127* 129*  --   --  124*  --   K 4.6  --  5.1 4.9  --   --  4.6  --   CL 97*  --   --  96*  --   --  93*  --   CO2 21*  --   --  24  --   --  22  --   GLUCOSE 117*  --   --  102*  --   --  142*  --   BUN 21  --   --  22  --   --  26*  --   CREATININE 1.04*  --   --  0.92  --   --  0.83  --   CALCIUM 8.8*  --   --  8.6*  --   --  8.8*  --   AST  --   --   --   --   --   --  45*   --   ALT  --   --   --   --   --   --  21  --   ALKPHOS  --   --   --   --   --   --  64  --   BILITOT  --   --   --   --   --   --  0.4  --   ALBUMIN  --   --   --   --   --   --  3.0*  --   MG  --   --   --   --   --  1.6* 2.2  --   CRP  --   --   --   --   --  17.5* 21.6*  --   DDIMER  --   --   --   --   --  3.93* 4.83*  --   PROCALCITON  --   --   --   --   --   --   --  6.11  LATICACIDVEN  --  2.2*  --  2.3*  --   --   --   --   BNP  --   --   --   --  371.5*  --  342.4*  --     ------------------------------------------------------------------------------------------------------------------ No results for input(s): CHOL, HDL, LDLCALC, TRIG, CHOLHDL, LDLDIRECT in the last 72 hours.  No results found for: HGBA1C  No results for input(s): TSH, T4TOTAL, T3FREE, THYROIDAB in the last 72 hours.  Invalid input(s): FREET3 ------------------------------------------------------------------------------------------------------------------ ID Labs Recent Labs  Lab 01/08/21 1538 01/08/21 1750 12/31/20 0318 12/31/20 0626 01/01/21 0016 01/01/21 0550  WBC 11.3*  --   --   --  4.6  --   PLT 212  --   --   --  202  --   CRP  --   --   --  17.5* 21.6*  --   DDIMER  --   --   --  3.93* 4.83*  --   PROCALCITON  --   --   --   --   --  6.11  LATICACIDVEN  --  2.2* 2.3*  --   --   --   CREATININE 1.04*  --  0.92  --  0.83  --    Cardiac Enzymes No results for input(s): CKMB, TROPONINI, MYOGLOBIN in the last 168 hours.  Invalid input(s): CK   Radiology Reports DG Chest Port 1 View  Result Date: 01/01/2021 CLINICAL DATA:  Shortness of breath. EXAM: PORTABLE CHEST 1 VIEW COMPARISON:  12/31/2020 FINDINGS: Normal heart size and mediastinal contours. Bilateral airspace opacities are noted involving the right upper and lower lobes and left lower lobe. Compared with the previous exam there is been worsening aeration to the right lung. IMPRESSION: Bilateral multifocal airspace opacities  compatible with multifocal pneumonia. Interval worsening aeration to the right lung. Electronically Signed   By: Signa Kell M.D.   On: 01/01/2021 07:22   DG Chest Port 1 View  Result Date: 12/31/2020 CLINICAL DATA:  87 year old female with fever and shortness of breath. EXAM: PORTABLE CHEST 1 VIEW COMPARISON:  26-Jan-2021 portable chest and earlier. FINDINGS: Portable AP semi upright view at 0621 hours. Mildly improved lung volumes. Mild tortuosity of the thoracic aorta. Other mediastinal contours are within normal limits. No pneumothorax or pulmonary edema. Regressed but not resolved mild patchy opacity at the left lung base, although also similar to September and April chest radiographs this year. And there is probably a small chronic left pleural effusion. No areas of worsening ventilation. No acute osseous abnormality identified. IMPRESSION: Probable small chronic left pleural effusion with mild atelectasis or scarring at the left lung base. No definite acute cardiopulmonary abnormality. Electronically Signed   By: Odessa Fleming M.D.   On: 12/31/2020 06:38   DG Chest Portable 1 View  Result Date: January 26, 2021 CLINICAL DATA:  Cough, flu positive. EXAM: PORTABLE CHEST 1 VIEW COMPARISON:  October 09, 2020 FINDINGS: Cardiomediastinal silhouette is normal. Mediastinal contours appear intact. Mild peribronchial airspace consolidation the left lower lobe. Osseous structures are without acute abnormality. Soft tissues are grossly normal. IMPRESSION: Mild peribronchial airspace consolidation the left lower lobe may represent early atypical pneumonia. Electronically Signed   By: Ted Mcalpine M.D.   On: January 26, 2021 16:23

## 2021-01-01 NOTE — Progress Notes (Signed)
Date and time results received: 01/01/21 0604    Test: O2 level Critical Value: <31  Name of Provider Notified: Chotiner, B. MD    Orders Received? Or Actions Taken?: Bi-Pap ordered for Pt.

## 2021-01-02 DIAGNOSIS — R0902 Hypoxemia: Secondary | ICD-10-CM | POA: Diagnosis not present

## 2021-01-02 MED ORDER — ASPIRIN 300 MG RE SUPP
150.0000 mg | Freq: Two times a day (BID) | RECTAL | Status: DC
  Filled 2021-01-02 (×2): qty 1

## 2021-01-02 MED ORDER — ACETAMINOPHEN 650 MG RE SUPP: 650.0000 mg | Freq: Three times a day (TID) | RECTAL | Status: DC

## 2021-01-28 NOTE — Progress Notes (Signed)
PROGRESS NOTE                                                                                                                                                                                                             Patient Demographics:    Sharon Reed, is a 88 y.o. female, DOB - [birth year], XBJ:[id number]  Outpatient Primary MD for the patient is Harlen Labs, NP    LOS - 3  Admit date - 12/28/2020    Chief Complaint  Patient presents with   Shortness of Breath       Brief Narrative (HPI from H&P) - 88 y.o. female with medical history significant of COPD, HTN, dementia came with 4 days of cough, wheezing, SOB and fever, in the ER she was diagnosed with acute hypoxic respiratory failure due to influenza A pneumonia and admitted to the hospital.   Subjective:   Patient in bed appears to be in no distress but somnolent.  Unable to answer questions or follow commands.   Assessment  & Plan :    And is now transition to full comfort measures.  All known comfort medications have been stopped.  She appears to be near death.    Acute Hypoxic Resp. Failure due to influenza pneumonia with ARDS- she had moderate inflammation upon admission however it has gradually progressed even more despite Tamiflu and steroids, is DNR and prognosis remains grim, currently on BiPAP, continue supportive care, most likely will not survive this event, will involve palliative care as well.  Encouraged the patient to sit up in chair in the daytime use I-S and flutter valve for pulmonary toiletry.  Will advance activity and titrate down oxygen as possible.  2.  Hyponatremia, hypomagnesemia.  Due to dehydration, has been hydrated with IV fluids, magnesium has been replaced.  Due to severe and progressive hypoxic respiratory failure with holding further fluids and giving trial of Lasix as she is rapidly worsening her respiratory status.  3.   Elevated D-dimer.  Likely due to inflammation from #1 above, Lovenox at prophylactic dose for now, lower extremity venous duplex.  4.  Hypertension.  On Norvasc along with beta-blocker.  Monitor.  5.  Moderate protein calorie malnutrition.  Protein supplementation.        Condition - Extremely Guarded  Family Communication  :  daughter bedside 12/31/20 -  DNR, poor prognosis, discussed with daughter and another family member over the phone in detail on 01/01/2021, medical treatment if significant decline then goal be to keep her comfortable.  Multiple family members updated bedside in detail on 01/17/21.  Code Status :  DNR  Consults  : Palliative care  PUD Prophylaxis : PPI   Procedures  :            Disposition Plan  :    Status is: Inpatient  Remains inpatient appropriate because: Flu Pneumonia    DVT Prophylaxis  :       Lab Results  Component Value Date   PLT 202 01/01/2021    Diet :  Diet Order             DIET SOFT Room service appropriate? Yes; Fluid consistency: Thin  Diet effective now                    Inpatient Medications  Scheduled Meds:  acetaminophen  650 mg Rectal TID   aspirin  150 mg Rectal BID   glycopyrrolate  0.4 mg Intravenous QID   Continuous Infusions:  morphine 2 mg/hr (01/01/21 2339)    PRN Meds:.acetaminophen, LORazepam, morphine injection, morphine, phenol  Antibiotics  :    Anti-infectives (From admission, onward)    Start     Dose/Rate Route Frequency Ordered Stop   01/21/2021 1800  oseltamivir (TAMIFLU) capsule 30 mg  Status:  Discontinued        30 mg Oral Every 24 hours 01/07/2021 1734 01/01/21 0942        Time Spent in minutes  30   Susa Raring M.D on 01-17-21 at 8:56 AM  To page go to [email or web address]   [facility] -  Office  [phone]  See all Orders from today for further details    Objective:   Vitals:   01/01/21 1338 01/01/21 1450 01/01/21 1610 2021/01/17 0245  BP:      Pulse:  (!) 115     Resp:  (!) 32 (!) 29   Temp:   99.4 F (37.4 C) (!) 103.5 F (39.7 C)  TempSrc:   Axillary Axillary  SpO2: 96%     Weight:      Height:        Wt Readings from Last 3 Encounters:  12/31/20 47.8 kg  07/02/20 46.7 kg     Intake/Output Summary (Last 24 hours) at 01/17/21 0856 Last data filed at 01/01/2021 2339 Gross per 24 hour  Intake 17.98 ml  Output --  Net 17.98 ml     Physical Exam  Patient in bed somnolent but in no distress.  Abdomen soft.  Bibasilar Rales      Data Review:    CBC Recent Labs  Lab 12/29/2020 1538 01/07/2021 1811 01/01/21 0016  WBC 11.3*  --  4.6  HGB 13.3 13.9 12.2  HCT 40.9 41.0 36.8  PLT 212  --  202  MCV 89.5  --  85.8  MCH 29.1  --  28.4  MCHC 32.5  --  33.2  RDW 13.5  --  13.4  LYMPHSABS 0.1*  --  0.6*  MONOABS 0.1  --  0.5  EOSABS 0.0  --  0.0  BASOSABS 0.0  --  0.0    Electrolytes Recent Labs  Lab 01/15/2021 1538 01/03/2021 1750 01/18/2021 1811 12/31/20 0318 12/31/20 0603 12/31/20 0626 01/01/21 0016 01/01/21 0550  NA 128*  --  127* 129*  --   --  124*  --   K 4.6  --  5.1 4.9  --   --  4.6  --   CL 97*  --   --  96*  --   --  93*  --   CO2 21*  --   --  24  --   --  22  --   GLUCOSE 117*  --   --  102*  --   --  142*  --   BUN 21  --   --  22  --   --  26*  --   CREATININE 1.04*  --   --  0.92  --   --  0.83  --   CALCIUM 8.8*  --   --  8.6*  --   --  8.8*  --   AST  --   --   --   --   --   --  45*  --   ALT  --   --   --   --   --   --  21  --   ALKPHOS  --   --   --   --   --   --  64  --   BILITOT  --   --   --   --   --   --  0.4  --   ALBUMIN  --   --   --   --   --   --  3.0*  --   MG  --   --   --   --   --  1.6* 2.2  --   CRP  --   --   --   --   --  17.5* 21.6*  --   DDIMER  --   --   --   --   --  3.93* 4.83*  --   PROCALCITON  --   --   --   --   --   --   --  6.11  LATICACIDVEN  --  2.2*  --  2.3*  --   --   --   --   BNP  --   --   --   --  371.5*  --  342.4*  --      ------------------------------------------------------------------------------------------------------------------ No results for input(s): CHOL, HDL, LDLCALC, TRIG, CHOLHDL, LDLDIRECT in the last 72 hours.  No results found for: HGBA1C  No results for input(s): TSH, T4TOTAL, T3FREE, THYROIDAB in the last 72 hours.  Invalid input(s): FREET3 ------------------------------------------------------------------------------------------------------------------ ID Labs Recent Labs  Lab Jan 14, 2021 1538 2021-01-14 1750 12/31/20 0318 12/31/20 0626 01/01/21 0016 01/01/21 0550  WBC 11.3*  --   --   --  4.6  --   PLT 212  --   --   --  202  --   CRP  --   --   --  17.5* 21.6*  --   DDIMER  --   --   --  3.93* 4.83*  --   PROCALCITON  --   --   --   --   --  6.11  LATICACIDVEN  --  2.2* 2.3*  --   --   --   CREATININE 1.04*  --  0.92  --  0.83  --    Cardiac Enzymes No results for input(s): CKMB, TROPONINI, MYOGLOBIN in the last 168 hours.  Invalid input(s): CK   Radiology Reports DG Chest [facility] 1 [address]  Result Date: 01/01/2021 CLINICAL DATA:  Shortness of breath. EXAM: PORTABLE CHEST 1 VIEW COMPARISON:  12/31/2020 FINDINGS: Normal heart size and mediastinal contours. Bilateral airspace opacities are noted involving the right upper and lower lobes and left lower lobe. Compared with the previous exam there is been worsening aeration to the right lung. IMPRESSION: Bilateral multifocal airspace opacities compatible with multifocal pneumonia. Interval worsening aeration to the right lung. Electronically Signed   By: Signa Kell M.D.   On: 01/01/2021 07:22   DG Chest Port 1 View  Result Date: 12/31/2020 CLINICAL DATA:  88 year old female with fever and shortness of breath. EXAM: PORTABLE CHEST 1 VIEW COMPARISON:  2021/01/20 portable chest and earlier. FINDINGS: Portable AP semi upright view at 0621 hours. Mildly improved lung volumes. Mild tortuosity of the thoracic aorta. Other mediastinal  contours are within normal limits. No pneumothorax or pulmonary edema. Regressed but not resolved mild patchy opacity at the left lung base, although also similar to September and April chest radiographs this year. And there is probably a small chronic left pleural effusion. No areas of worsening ventilation. No acute osseous abnormality identified. IMPRESSION: Probable small chronic left pleural effusion with mild atelectasis or scarring at the left lung base. No definite acute cardiopulmonary abnormality. Electronically Signed   By: Odessa Fleming M.D.   On: 12/31/2020 06:38   DG Chest Portable 1 View  Result Date: January 20, 2021 CLINICAL DATA:  Cough, flu positive. EXAM: PORTABLE CHEST 1 VIEW COMPARISON:  October 09, 2020 FINDINGS: Cardiomediastinal silhouette is normal. Mediastinal contours appear intact. Mild peribronchial airspace consolidation the left lower lobe. Osseous structures are without acute abnormality. Soft tissues are grossly normal. IMPRESSION: Mild peribronchial airspace consolidation the left lower lobe may represent early atypical pneumonia. Electronically Signed   By: Ted Mcalpine M.D.   On: 01-20-2021 16:23

## 2021-01-28 NOTE — Death Summary Note (Signed)
[facility] Death Note                                                                                                                                                                                               Sharon Reed, is a 88 y.o. female, DOB - [birth year], VFI:[id number]  Admit date - 01/16/2021   Admitting Physician Emeline General, MD  Outpatient Primary MD for the patient is Harlen Labs, NP  LOS - 3  Chief Complaint  Patient presents with   Shortness of Breath       Notification: Harlen Labs, NP notified of death of 2021/01/24   Date and Time of Death - 2021/01/24 at 8.55 am  Pronounced by - RN  History of present illness:   Sharon Reed is a 88 y.o. female with a history of -  of COPD, HTN, dementia came with 4 days of cough, wheezing, SOB and fever, in the ER she was diagnosed with acute hypoxic respiratory failure due to influenza A pneumonia and admitted to the hospital, despite appropriate treatment she went into ARDS and was eventually transition to full comfort measures.  She passed away peacefully on 01-24-2021 at 8:55 AM pronounced by the nursing staff.   Final Diagnoses:  Cause if death - Influenza Infection  Signature  Susa Raring M.D on January 24, 2021 at 9:13 AM  [facility]  Office Phone -[phone]  Total clinical and documentation time for today Under 30 minutes   Last Note                                                                     PROGRESS NOTE  Patient Demographics:    Sharon Reed, is a 88 y.o. female, DOB - 05/12/1927, MWN:[id number]  Outpatient Primary MD for the patient is Harlen Labs, NP    LOS - 3  Admit date - 01-08-21     Chief Complaint  Patient presents with   Shortness of Breath       Brief Narrative (HPI from H&P) - 88 y.o. female with medical history significant of COPD, HTN, dementia came with 4 days of cough, wheezing, SOB and fever, in the ER she was diagnosed with acute hypoxic respiratory failure due to influenza A pneumonia and admitted to the hospital.   Subjective:   Patient in bed appears to be in no distress but somnolent.  Unable to answer questions or follow commands.   Assessment  & Plan :    And is now transition to full comfort measures.  All known comfort medications have been stopped.  She appears to be near death.    Acute Hypoxic Resp. Failure due to influenza pneumonia with ARDS- she had moderate inflammation upon admission however it has gradually progressed even more despite Tamiflu and steroids, is DNR and prognosis remains grim, currently on BiPAP, continue supportive care, most likely will not survive this event, will involve palliative care as well.  Encouraged the patient to sit up in chair in the daytime use I-S and flutter valve for pulmonary toiletry.  Will advance activity and titrate down oxygen as possible.  2.  Hyponatremia, hypomagnesemia.  Due to dehydration, has been hydrated with IV fluids, magnesium has been replaced.  Due to severe and progressive hypoxic respiratory failure with holding further fluids and giving trial of Lasix as she is rapidly worsening her respiratory status.  3.  Elevated D-dimer.  Likely due to inflammation from #1 above, Lovenox at prophylactic dose for now, lower extremity venous duplex.  4.  Hypertension.  On Norvasc along with beta-blocker.  Monitor.  5.  Moderate protein calorie malnutrition.  Protein supplementation.        Condition - Extremely Guarded  Family Communication  :  daughter bedside 12/31/20 - DNR, poor prognosis, discussed with daughter and another family member over the phone in detail on 01/01/2021, medical  treatment if significant decline then goal be to keep her comfortable.  Multiple family members updated bedside in detail on 01/22/2021.  Code Status :  DNR  Consults  : Palliative care  PUD Prophylaxis : PPI   Procedures  :            Disposition Plan  :    Status is: Inpatient  Remains inpatient appropriate because: Flu Pneumonia    DVT Prophylaxis  :       Lab Results  Component Value Date   PLT 202 01/01/2021    Diet :  Diet Order             DIET SOFT Room service appropriate? Yes; Fluid consistency: Thin  Diet effective now                    Inpatient Medications  Scheduled Meds:  acetaminophen  650 mg Rectal TID   aspirin  150 mg Rectal BID   glycopyrrolate  0.4 mg Intravenous QID   Continuous Infusions:  morphine Stopped (01/07/2021 0904)    PRN Meds:.acetaminophen, LORazepam, morphine injection, morphine, phenol  Antibiotics  :    Anti-infectives (From admission, onward)    Start     Dose/Rate Route Frequency Ordered Stop   January 08, 2021  1800  oseltamivir (TAMIFLU) capsule 30 mg  Status:  Discontinued        30 mg Oral Every 24 hours 01-06-21 1734 01/01/21 0942        Time Spent in minutes  30   Susa Raring M.D on 01/19/2021 at 9:13 AM  To page go to [email or web address]   [facility] -  Office  [phone]  See all Orders from today for further details    Objective:   Vitals:   01/01/21 1338 01/01/21 1450 01/01/21 1610 01/24/2021 0245  BP:      Pulse: (!) 115     Resp:  (!) 32 (!) 29   Temp:   99.4 F (37.4 C) (!) 103.5 F (39.7 C)  TempSrc:   Axillary Axillary  SpO2: 96%     Weight:      Height:        Wt Readings from Last 3 Encounters:  12/31/20 47.8 kg  07/02/20 46.7 kg     Intake/Output Summary (Last 24 hours) at 01/09/2021 0913 Last data filed at 01/26/2021 0855 Gross per 24 hour  Intake 38.54 ml  Output --  Net 38.54 ml     Physical Exam  Patient in bed somnolent but in no distress.  Abdomen  soft.  Bibasilar Rales      Data Review:    CBC Recent Labs  Lab January 06, 2021 1538 Jan 06, 2021 1811 01/01/21 0016  WBC 11.3*  --  4.6  HGB 13.3 13.9 12.2  HCT 40.9 41.0 36.8  PLT 212  --  202  MCV 89.5  --  85.8  MCH 29.1  --  28.4  MCHC 32.5  --  33.2  RDW 13.5  --  13.4  LYMPHSABS 0.1*  --  0.6*  MONOABS 0.1  --  0.5  EOSABS 0.0  --  0.0  BASOSABS 0.0  --  0.0    Electrolytes Recent Labs  Lab 06-Jan-2021 1538 2021/01/06 1750 Jan 06, 2021 1811 12/31/20 0318 12/31/20 0603 12/31/20 0626 01/01/21 0016 01/01/21 0550  NA 128*  --  127* 129*  --   --  124*  --   K 4.6  --  5.1 4.9  --   --  4.6  --   CL 97*  --   --  96*  --   --  93*  --   CO2 21*  --   --  24  --   --  22  --   GLUCOSE 117*  --   --  102*  --   --  142*  --   BUN 21  --   --  22  --   --  26*  --   CREATININE 1.04*  --   --  0.92  --   --  0.83  --   CALCIUM 8.8*  --   --  8.6*  --   --  8.8*  --   AST  --   --   --   --   --   --  45*  --   ALT  --   --   --   --   --   --  21  --   ALKPHOS  --   --   --   --   --   --  64  --   BILITOT  --   --   --   --   --   --  0.4  --  ALBUMIN  --   --   --   --   --   --  3.0*  --   MG  --   --   --   --   --  1.6* 2.2  --   CRP  --   --   --   --   --  17.5* 21.6*  --   DDIMER  --   --   --   --   --  3.93* 4.83*  --   PROCALCITON  --   --   --   --   --   --   --  6.11  LATICACIDVEN  --  2.2*  --  2.3*  --   --   --   --   BNP  --   --   --   --  371.5*  --  342.4*  --     ------------------------------------------------------------------------------------------------------------------ No results for input(s): CHOL, HDL, LDLCALC, TRIG, CHOLHDL, LDLDIRECT in the last 72 hours.  No results found for: HGBA1C  No results for input(s): TSH, T4TOTAL, T3FREE, THYROIDAB in the last 72 hours.  Invalid input(s): FREET3 ------------------------------------------------------------------------------------------------------------------ ID Labs Recent Labs  Lab  01/01/2021 1538 01/03/2021 1750 12/31/20 0318 12/31/20 0626 01/01/21 0016 01/01/21 0550  WBC 11.3*  --   --   --  4.6  --   PLT 212  --   --   --  202  --   CRP  --   --   --  17.5* 21.6*  --   DDIMER  --   --   --  3.93* 4.83*  --   PROCALCITON  --   --   --   --   --  6.11  LATICACIDVEN  --  2.2* 2.3*  --   --   --   CREATININE 1.04*  --  0.92  --  0.83  --    Cardiac Enzymes No results for input(s): CKMB, TROPONINI, MYOGLOBIN in the last 168 hours.  Invalid input(s): CK   Radiology Reports DG Chest Port 1 View  Result Date: 01/01/2021 CLINICAL DATA:  Shortness of breath. EXAM: PORTABLE CHEST 1 VIEW COMPARISON:  12/31/2020 FINDINGS: Normal heart size and mediastinal contours. Bilateral airspace opacities are noted involving the right upper and lower lobes and left lower lobe. Compared with the previous exam there is been worsening aeration to the right lung. IMPRESSION: Bilateral multifocal airspace opacities compatible with multifocal pneumonia. Interval worsening aeration to the right lung. Electronically Signed   By: Signa Kell M.D.   On: 01/01/2021 07:22   DG Chest Port 1 View  Result Date: 12/31/2020 CLINICAL DATA:  88 year old female with fever and shortness of breath. EXAM: PORTABLE CHEST 1 VIEW COMPARISON:  01/22/2021 portable chest and earlier. FINDINGS: Portable AP semi upright view at 0621 hours. Mildly improved lung volumes. Mild tortuosity of the thoracic aorta. Other mediastinal contours are within normal limits. No pneumothorax or pulmonary edema. Regressed but not resolved mild patchy opacity at the left lung base, although also similar to September and April chest radiographs this year. And there is probably a small chronic left pleural effusion. No areas of worsening ventilation. No acute osseous abnormality identified. IMPRESSION: Probable small chronic left pleural effusion with mild atelectasis or scarring at the left lung base. No definite acute cardiopulmonary  abnormality. Electronically Signed   By: Odessa Fleming M.D.   On: 12/31/2020 06:38   DG Chest Portable 1 View  Result  Date: 01/09/21 CLINICAL DATA:  Cough, flu positive. EXAM: PORTABLE CHEST 1 VIEW COMPARISON:  October 09, 2020 FINDINGS: Cardiomediastinal silhouette is normal. Mediastinal contours appear intact. Mild peribronchial airspace consolidation the left lower lobe. Osseous structures are without acute abnormality. Soft tissues are grossly normal. IMPRESSION: Mild peribronchial airspace consolidation the left lower lobe may represent early atypical pneumonia. Electronically Signed   By: Ted Mcalpine M.D.   On: January 09, 2021 16:23

## 2021-01-28 NOTE — Plan of Care (Addendum)
                                      [facility]                            [address] [place], [place] [address]      Koraima Albertsen was admitted to the Hospital on 01/20/21 and passed away 01/23/2021 , she wished to be buried back in [place] which is her home country, her family members Clinton Wahlberg DOB 06/06/92 and Loletha Carrow DOB 07/09/61 who lived with her in [place] would like to accompany her dead body for the burial.  Unfortunately there passports have expired.  Kindly consider providing them emergency passport assistance so that they can accompany Ms. Shannon's dead body back to [place] for burial.   Call Susa Raring MD, [facility]  [phone] with questions.  Susa Raring M.D on 23-Jan-2021,at 10:03 AM  [facility]   Office  [phone]

## 2021-01-28 NOTE — Progress Notes (Signed)
Patient pronounced dead at 8:55am, Charlane Ferretti, RN second verifier of death. 61.46 mL wasted in steri-cycle with Charlane Ferretti, Emergency planning/management officer of unit.  Family is currently at bedside.

## 2021-01-28 NOTE — Plan of Care (Signed)
                                      [facility]                            [address] [place], [place] [address]      Kylene Zamarron was admitted to the Hospital on 01/16/2021 and died on  01-19-21 please excuse her family members from work for the next 7 days to facilitate her burial in [place].  Call Susa Raring MD, [facility]  [phone] with questions.  Susa Raring M.D on January 19, 2021,at 1:06 PM  [facility]   Office  [phone]

## 2021-01-28 DEATH — deceased
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
## 2022-06-02 IMAGING — DX DG KNEE 1-2V PORT*R*
1 series · 4 of 4 positions shown · non-contrast
Comparison: 10/09/2020

CLINICAL DATA: ORIF

EXAM:
PORTABLE RIGHT KNEE - 1-2 VIEW

[Series 1: knee · 0.14mm/px · 4 of 4 slices shown]
[im 1/4]
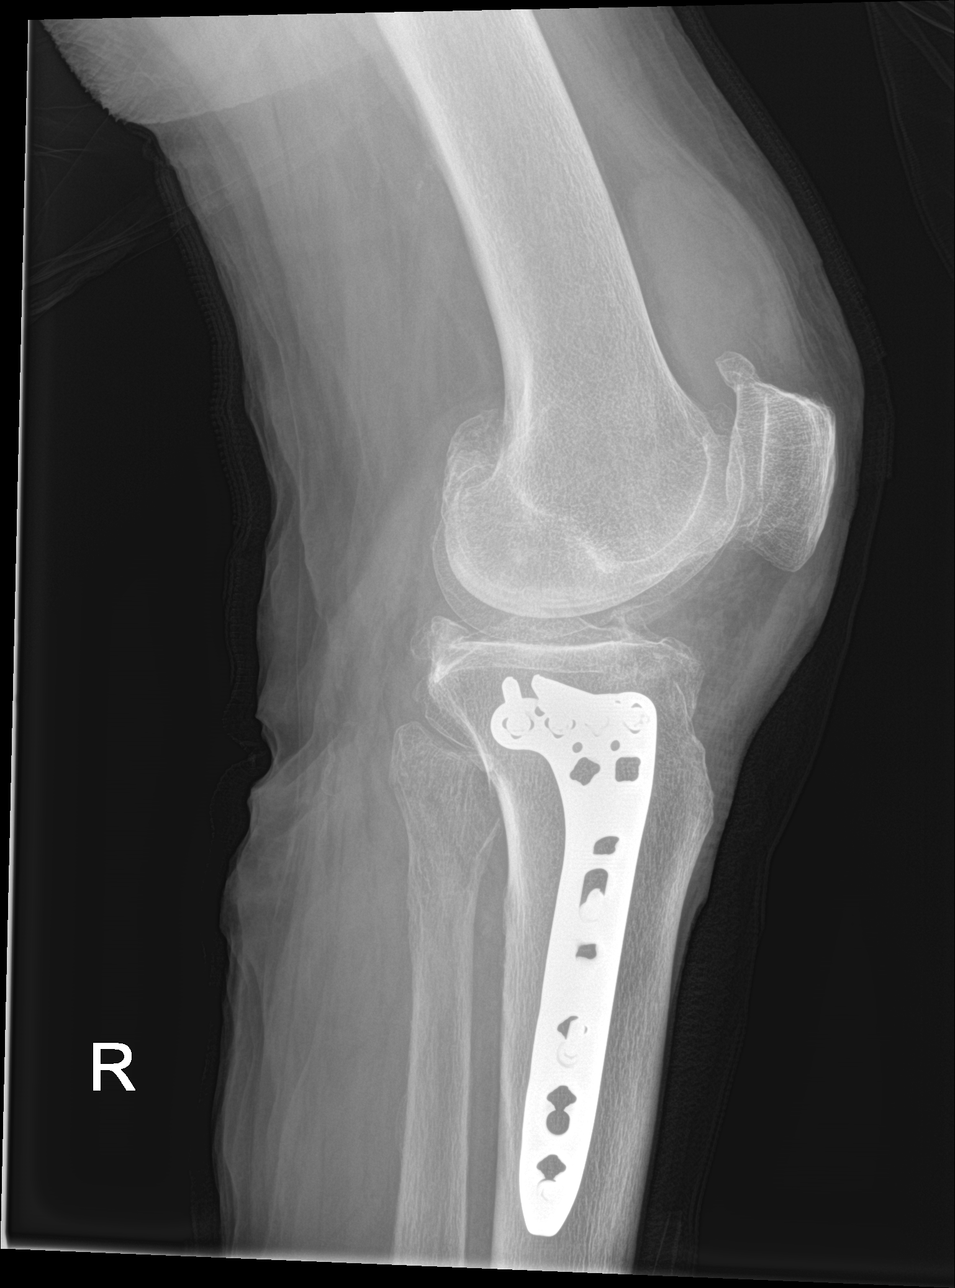
[im 2/4]
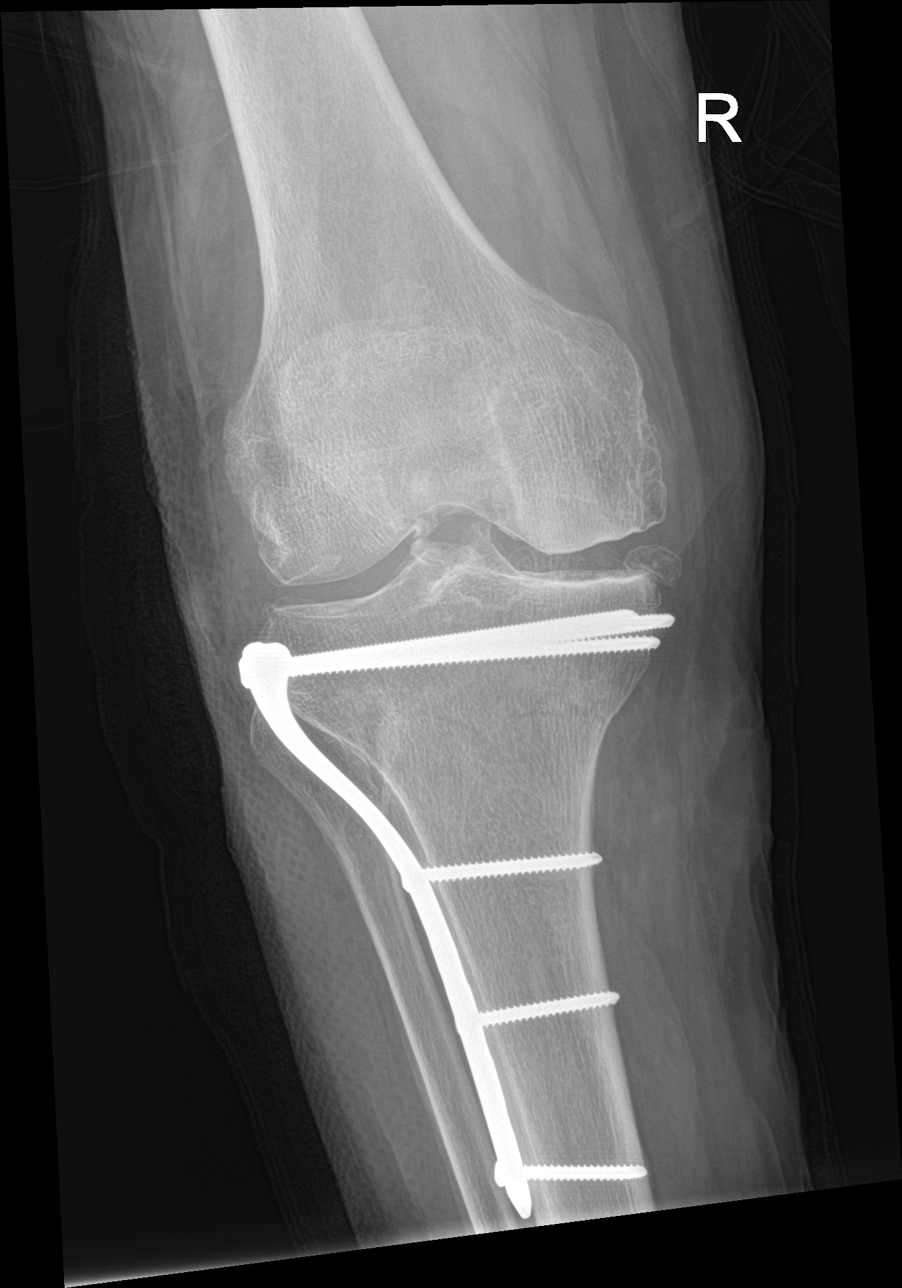
[im 3/4]
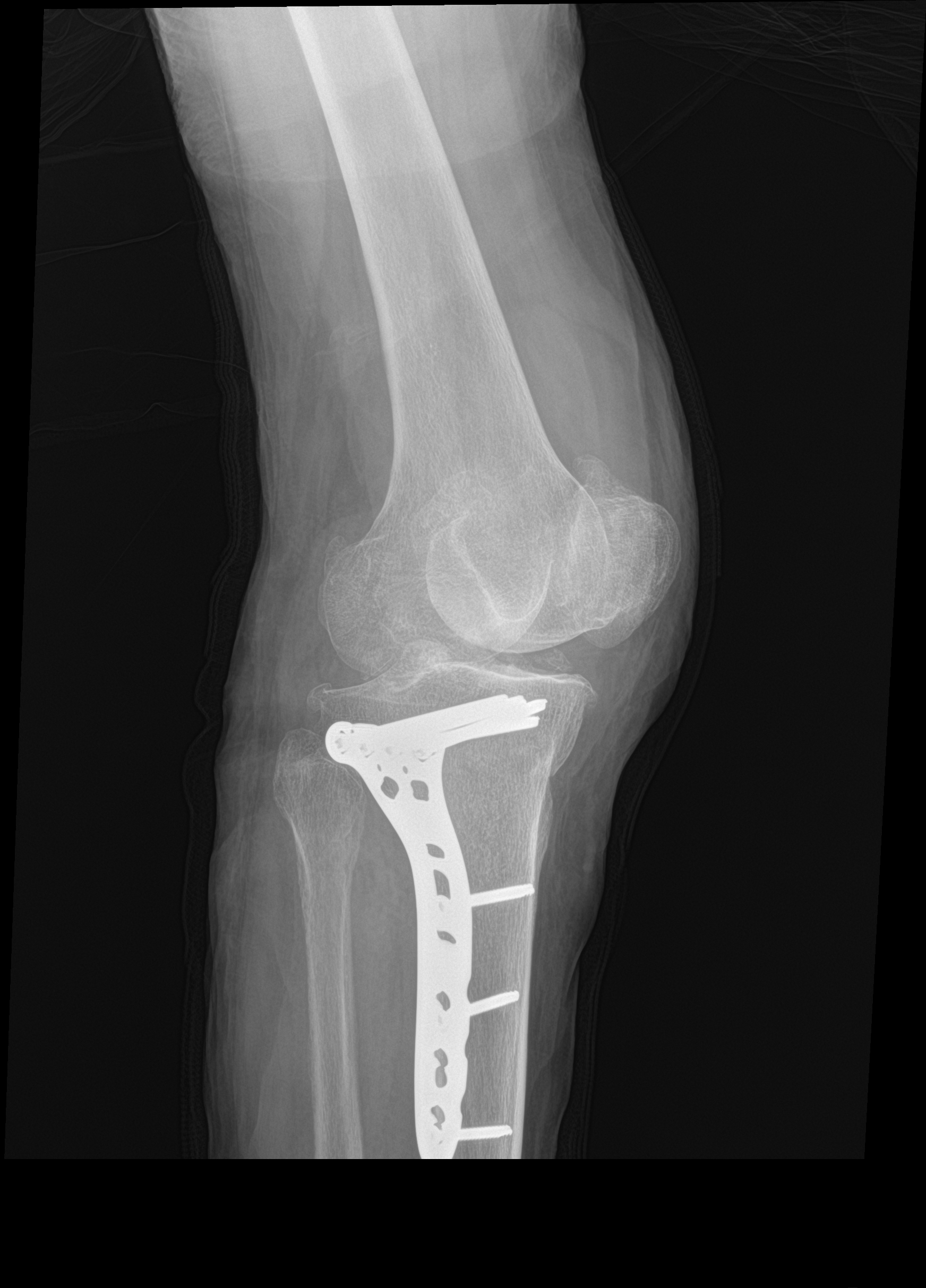
[im 4/4]
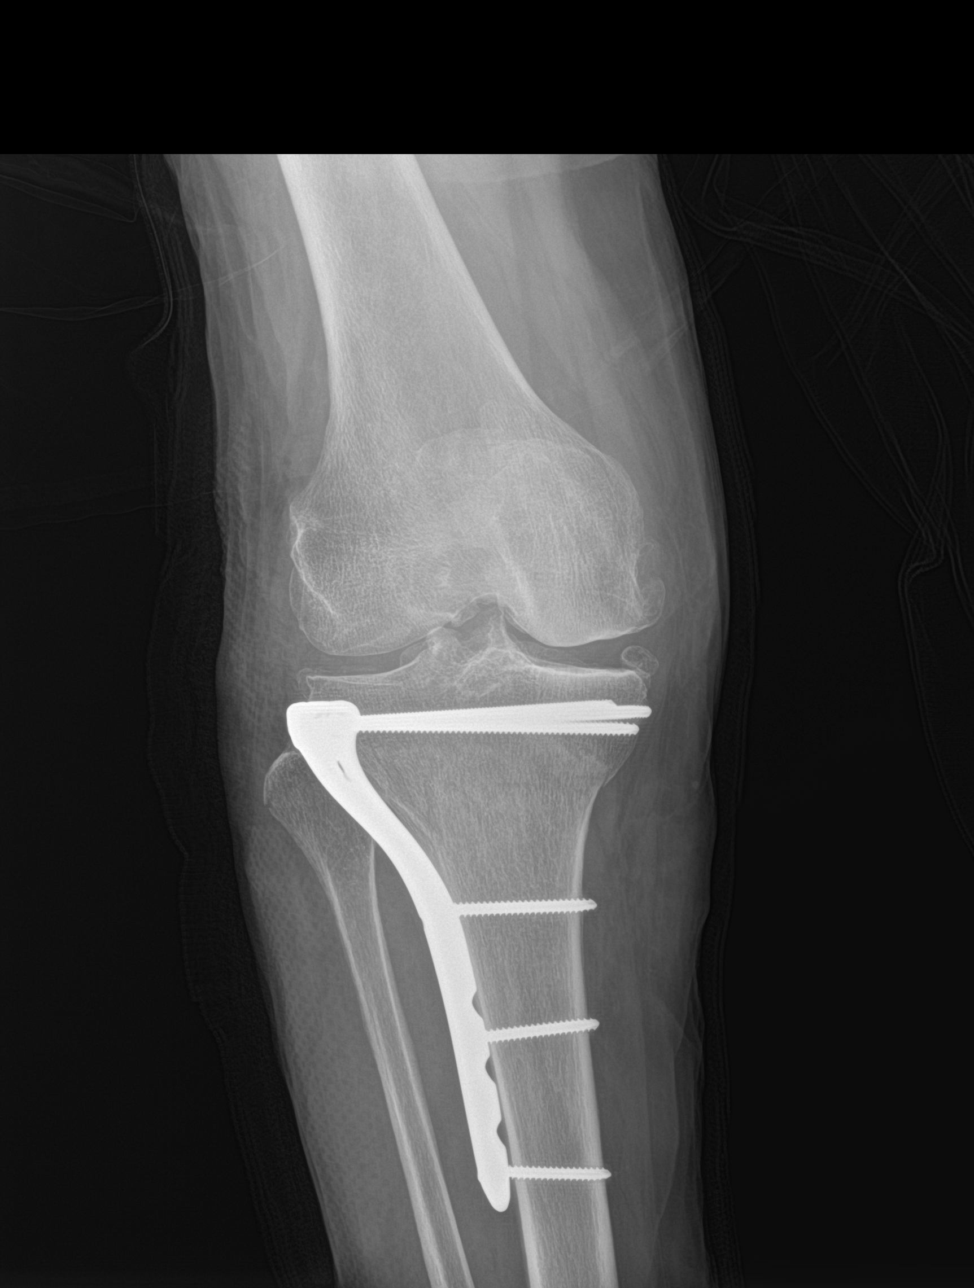

[4 of 4 positions shown; findings below may reference images not displayed]

FINDINGS: Frontal, bilateral oblique, lateral views of the right knee are
obtained. Interval placement of a lateral plate and screw traversing
the tibial plateau fracture seen previously. Alignment is near
anatomic. Proximal fibular fracture unchanged. Stable moderate joint
effusion and 3 compartmental osteoarthritis.
IMPRESSION: 1. ORIF right tibial plateau fracture with near anatomic alignment.
2. Stable essentially nondisplaced fibular head fracture.
3. Stable joint effusion and 3 compartmental osteoarthritis.

## 2022-06-02 IMAGING — RF DG TIBIA/FIBULA 2V*R*
1 series · 3 of 3 positions shown · non-contrast
Comparison: Right forearm x-ray 10/10/2010.

CLINICAL DATA: Open reduction internal fixation of radial fracture.

EXAM:
RIGHT FOREARM - 2 VIEW; RIGHT TIBIA AND FIBULA - 2 VIEW

[Series 1: run · 3 of 3 slices shown]
[im 1/3]
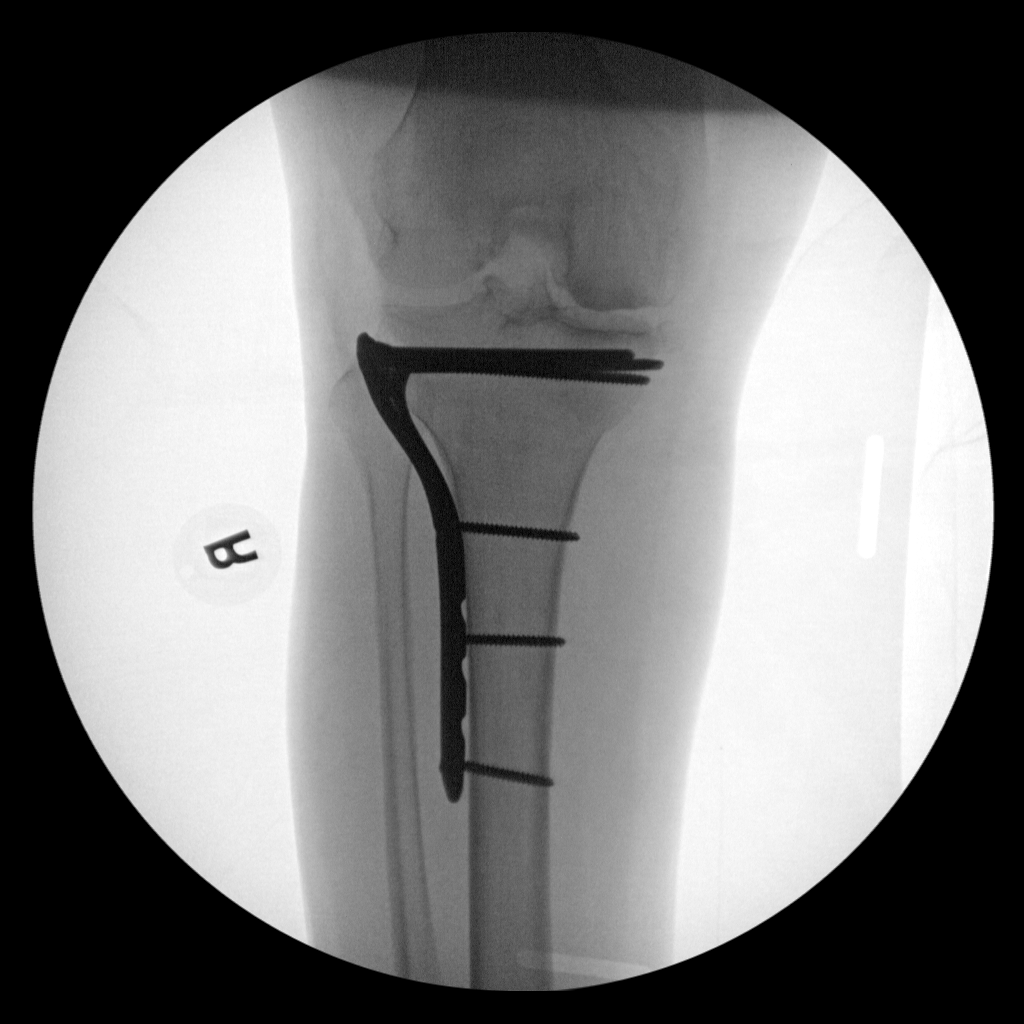
[im 2/3]
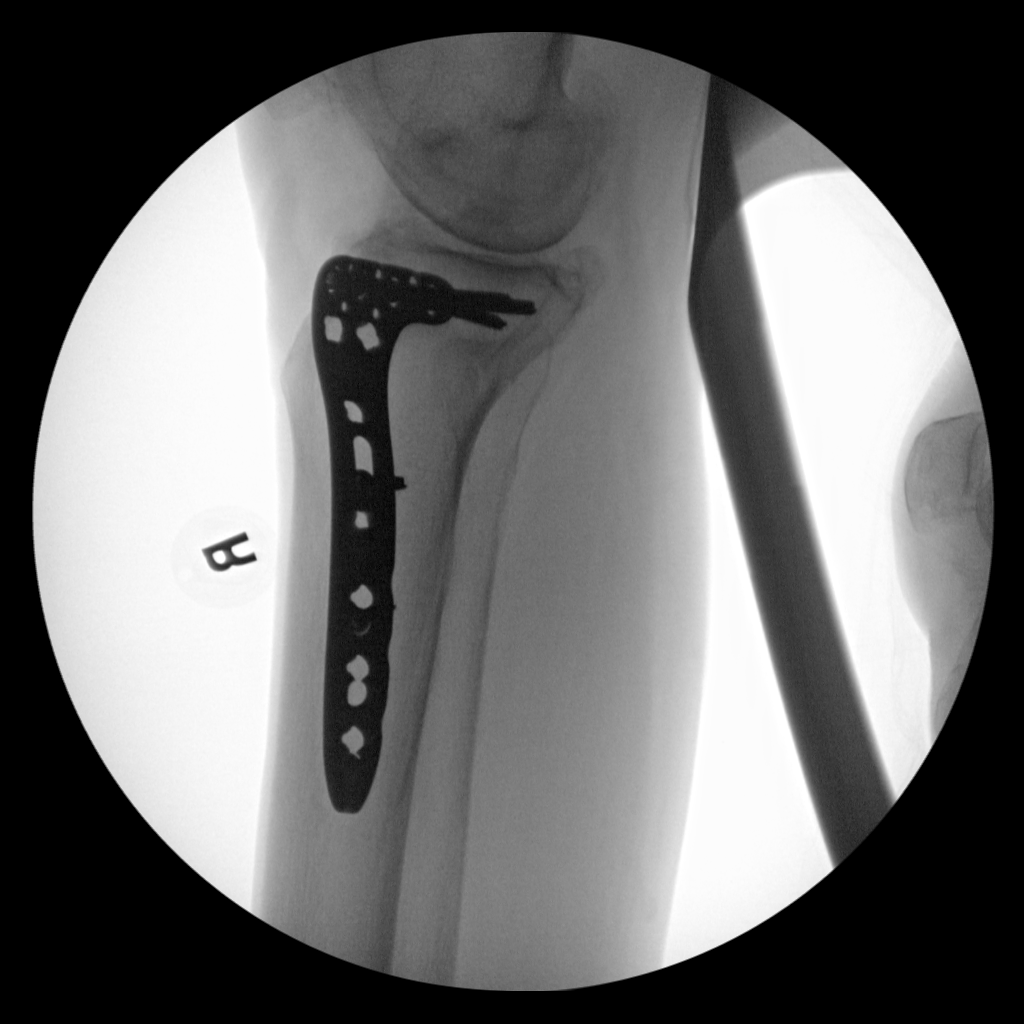
[im 3/3]
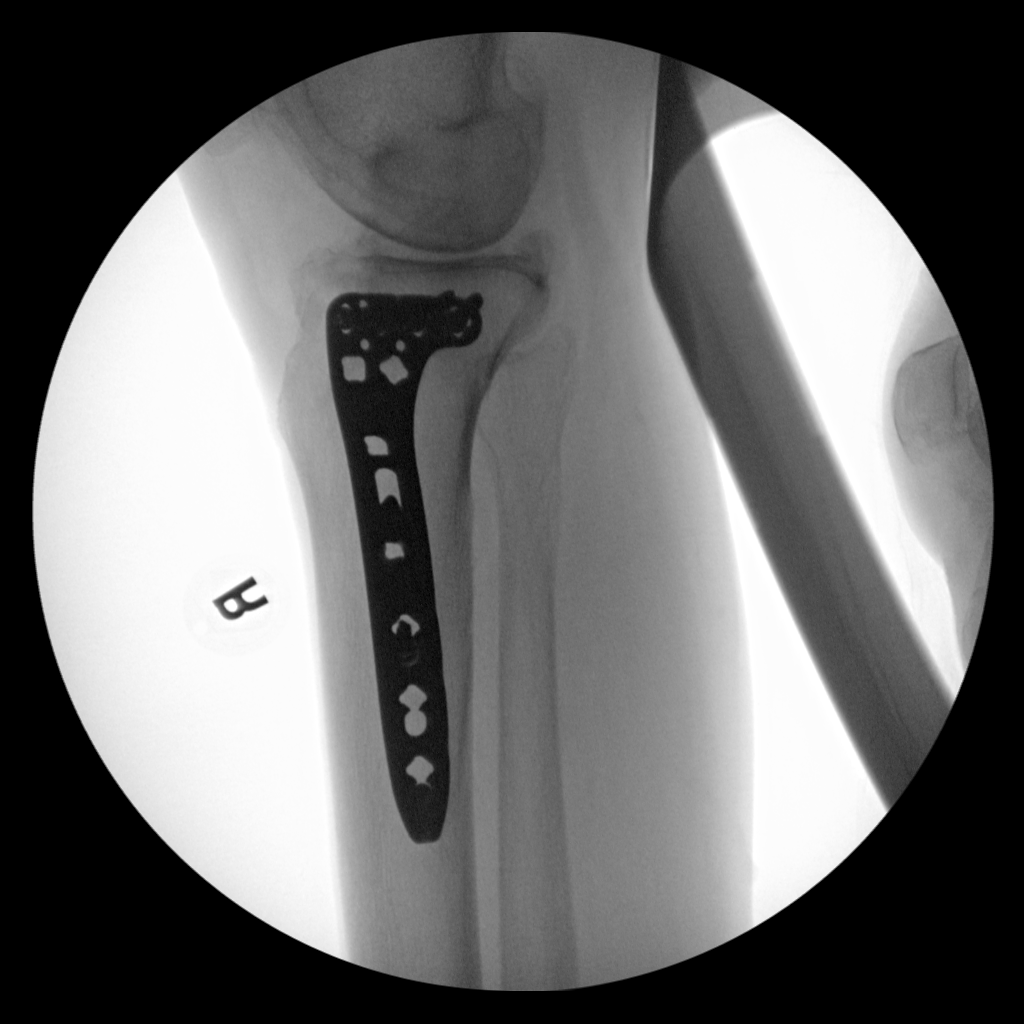

[3 of 3 positions shown; findings below may reference images not displayed]

FINDINGS: Right radius and ulna:

Five intraoperative fluoroscopic views of the right radius and ulna.

Sideplate and multiple screws fixate comminuted radial fracture.
Alignment appears anatomic. There is surrounding soft tissue
swelling.

Right tibia and fibula.

Three intraoperative fluoroscopic views of the right tibia and
fibula.

Lateral sideplate and multiple screws are seen fixating proximal
tibial metadiaphyseal fracture. Alignment is anatomic.

Total fluoroscopic time is 1 minutes and 9 seconds, 1.08 mGy.
IMPRESSION: 1. Open reduction internal fixation right radial and right tibial
fractures.

## 2022-06-02 IMAGING — RF DG FOREARM 2V*R*
1 series · 5 of 5 positions shown · non-contrast
Comparison: Right forearm x-ray 10/10/2010.

CLINICAL DATA: Open reduction internal fixation of radial fracture.

EXAM:
RIGHT FOREARM - 2 VIEW; RIGHT TIBIA AND FIBULA - 2 VIEW

[Series 1: run · 5 of 5 slices shown]
[im 1/5]
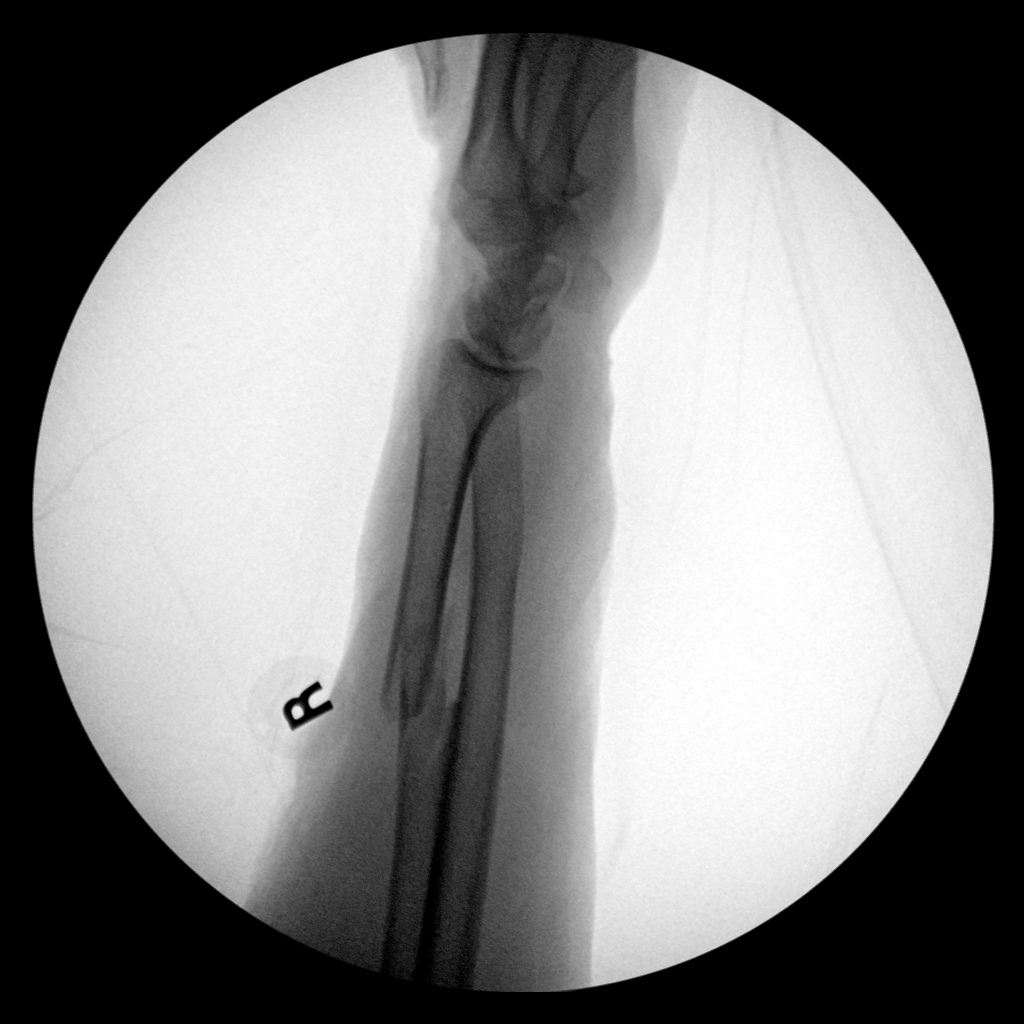
[im 2/5]
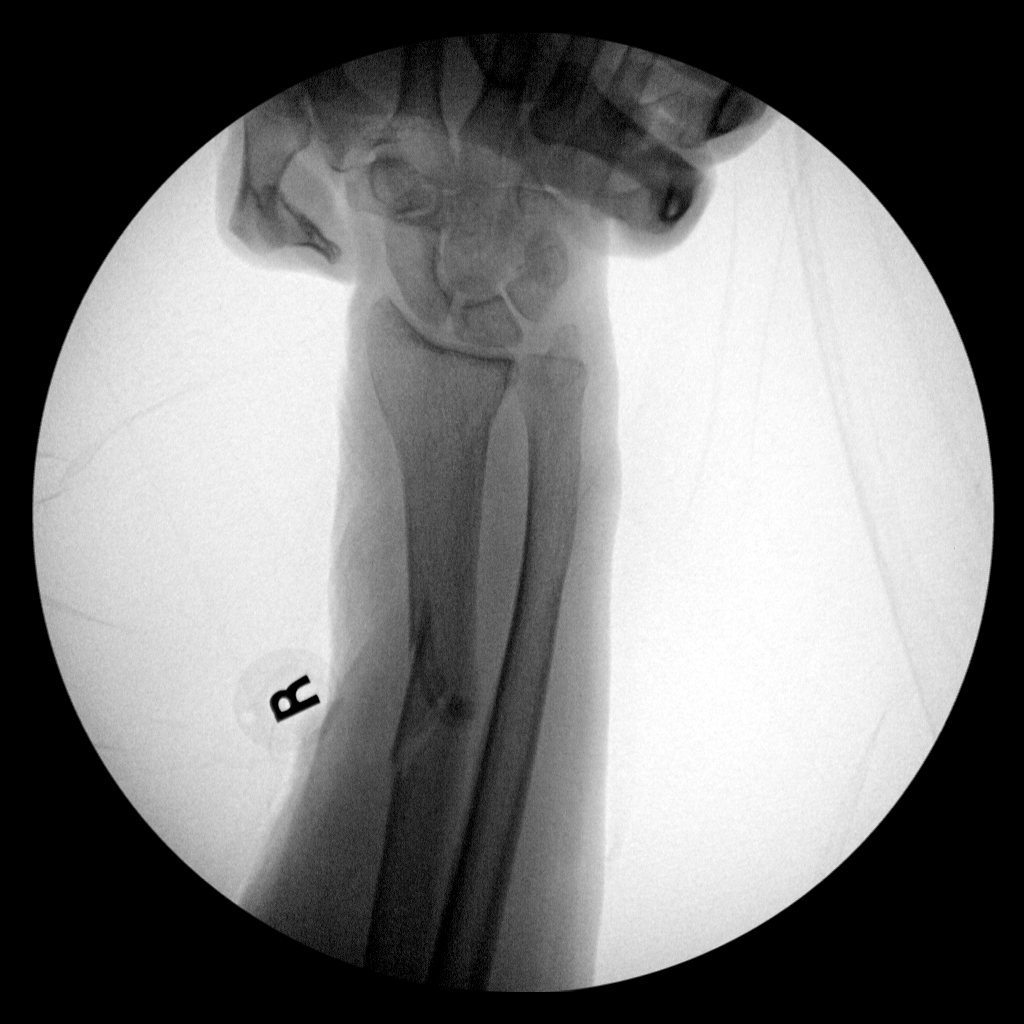
[im 3/5]
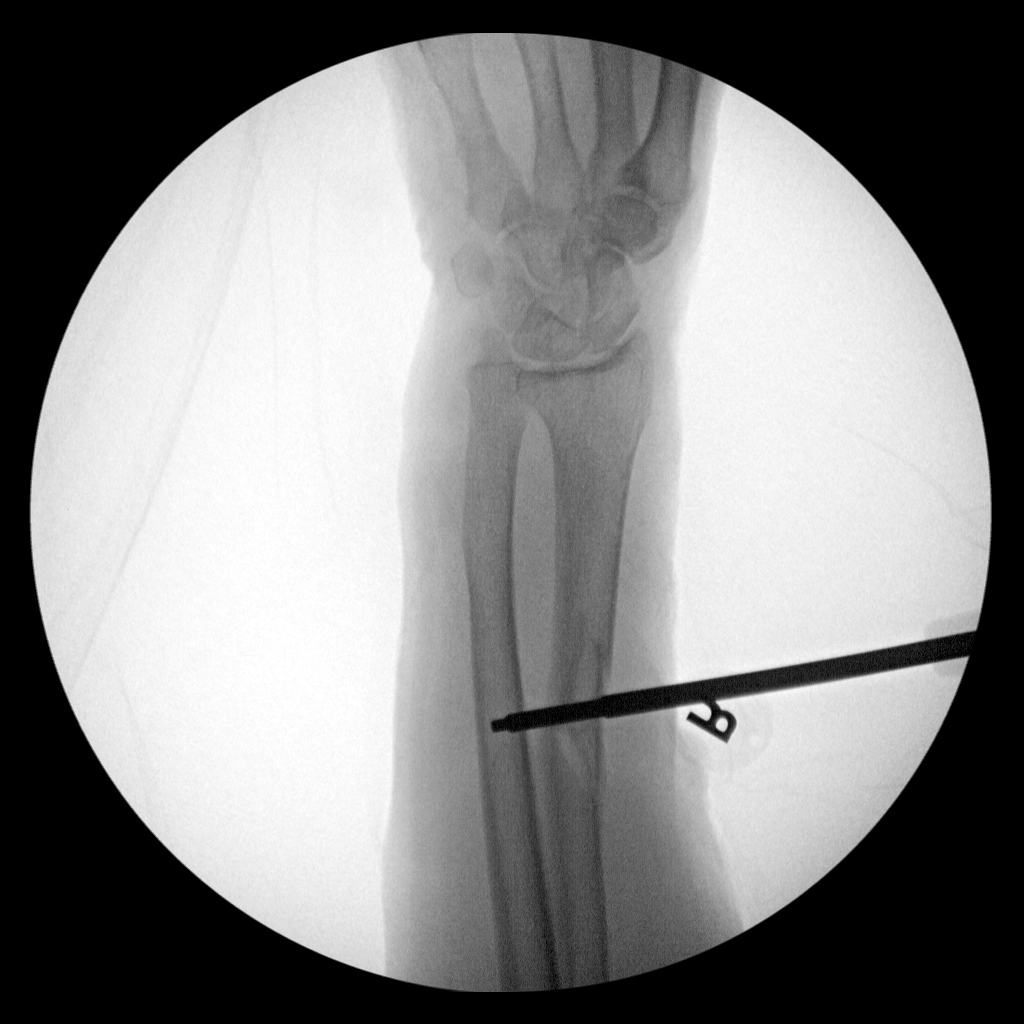
[im 4/5]
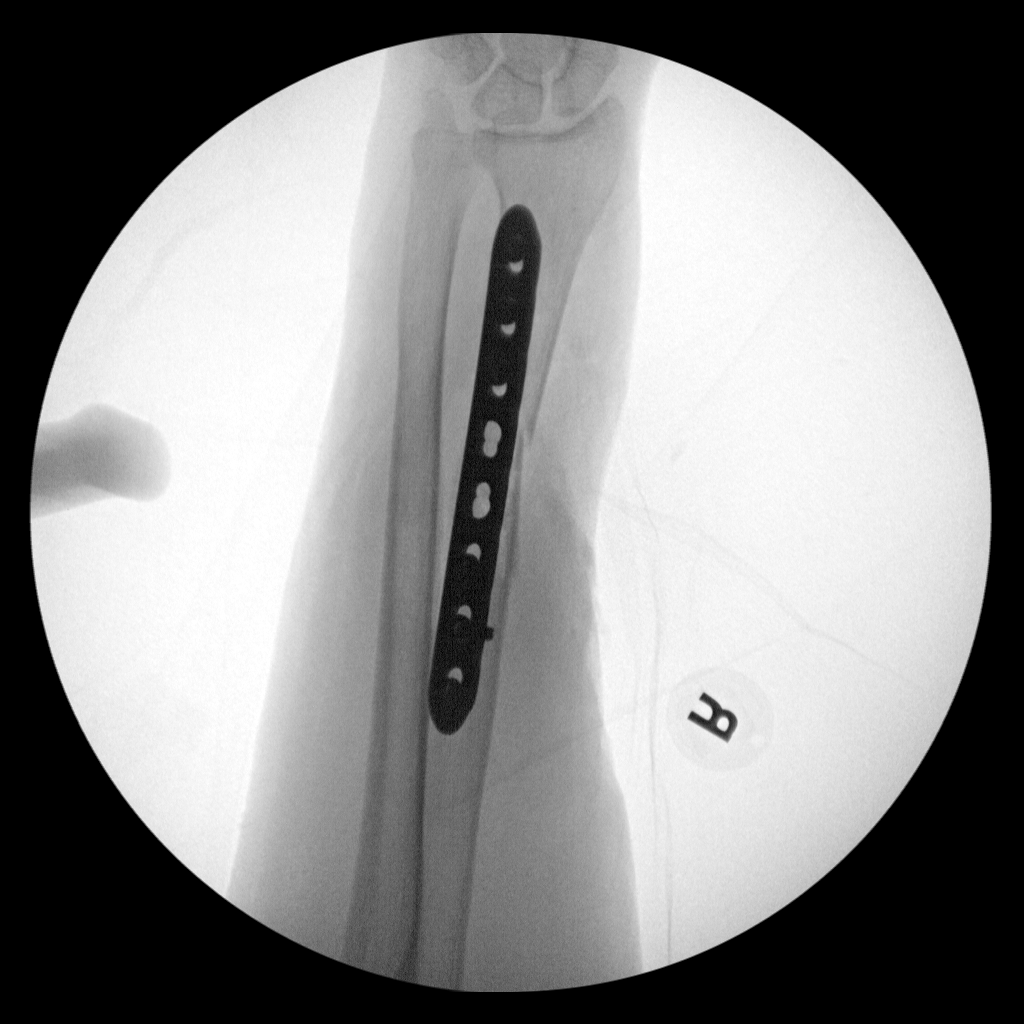
[im 5/5]
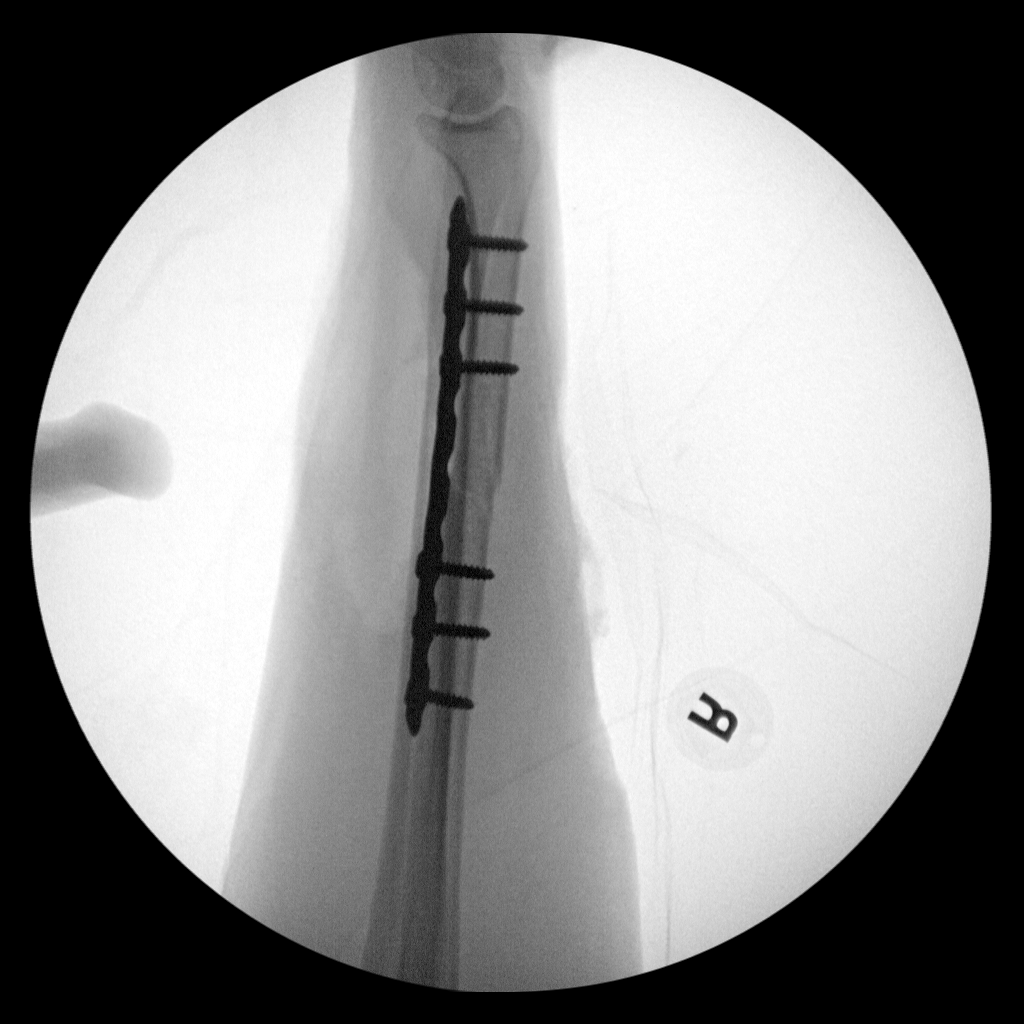

[5 of 5 positions shown; findings below may reference images not displayed]

FINDINGS: Right radius and ulna:

Five intraoperative fluoroscopic views of the right radius and ulna.

Sideplate and multiple screws fixate comminuted radial fracture.
Alignment appears anatomic. There is surrounding soft tissue
swelling.

Right tibia and fibula.

Three intraoperative fluoroscopic views of the right tibia and
fibula.

Lateral sideplate and multiple screws are seen fixating proximal
tibial metadiaphyseal fracture. Alignment is anatomic.

Total fluoroscopic time is 1 minutes and 9 seconds, 1.08 mGy.
IMPRESSION: 1. Open reduction internal fixation right radial and right tibial
fractures.
